# Patient Record
Sex: Male | Born: 1960
Health system: Southern US, Community
[De-identification: ages and names within clinical notes are randomized; demographics above are authoritative.]

## PROBLEM LIST (undated history)

## (undated) DIAGNOSIS — E785 Hyperlipidemia, unspecified: Secondary | ICD-10-CM

## (undated) DIAGNOSIS — I2510 Atherosclerotic heart disease of native coronary artery without angina pectoris: Secondary | ICD-10-CM

## (undated) DIAGNOSIS — K219 Gastro-esophageal reflux disease without esophagitis: Secondary | ICD-10-CM

## (undated) DIAGNOSIS — K760 Fatty (change of) liver, not elsewhere classified: Secondary | ICD-10-CM

## (undated) DIAGNOSIS — K589 Irritable bowel syndrome without diarrhea: Secondary | ICD-10-CM

## (undated) DIAGNOSIS — I10 Essential (primary) hypertension: Secondary | ICD-10-CM

## (undated) DIAGNOSIS — K279 Peptic ulcer, site unspecified, unspecified as acute or chronic, without hemorrhage or perforation: Secondary | ICD-10-CM

## (undated) DIAGNOSIS — N200 Calculus of kidney: Secondary | ICD-10-CM

## (undated) DIAGNOSIS — I970 Postcardiotomy syndrome: Secondary | ICD-10-CM

## (undated) HISTORY — DX: Calculus of kidney: N20.0

## (undated) HISTORY — DX: Peptic ulcer, site unspecified, unspecified as acute or chronic, without hemorrhage or perforation: K27.9

## (undated) HISTORY — DX: Atherosclerotic heart disease of native coronary artery without angina pectoris: I25.10

## (undated) HISTORY — DX: Postcardiotomy syndrome: I97.0

## (undated) HISTORY — DX: Fatty (change of) liver, not elsewhere classified: K76.0

## (undated) HISTORY — DX: Hyperlipidemia, unspecified: E78.5

## (undated) HISTORY — PX: CHOLECYSTECTOMY: SHX55

## (undated) HISTORY — DX: Essential (primary) hypertension: I10

## (undated) HISTORY — DX: Gastro-esophageal reflux disease without esophagitis: K21.9

## (undated) HISTORY — PX: CORONARY ARTERY BYPASS GRAFT: SHX141

## (undated) HISTORY — DX: Irritable bowel syndrome without diarrhea: K58.9

---

## 2005-02-22 ENCOUNTER — Ambulatory Visit: Payer: Self-pay | Admitting: Cardiology

## 2005-05-16 ENCOUNTER — Ambulatory Visit: Payer: Self-pay

## 2005-05-16 ENCOUNTER — Ambulatory Visit: Payer: Self-pay | Admitting: Cardiology

## 2005-06-03 ENCOUNTER — Ambulatory Visit: Payer: Self-pay | Admitting: Cardiology

## 2005-07-06 ENCOUNTER — Ambulatory Visit: Payer: Self-pay | Admitting: Cardiology

## 2005-07-27 ENCOUNTER — Ambulatory Visit: Payer: Self-pay | Admitting: Cardiovascular Disease

## 2005-07-28 ENCOUNTER — Ambulatory Visit: Payer: Self-pay

## 2005-08-26 ENCOUNTER — Ambulatory Visit: Payer: Self-pay | Admitting: Cardiology

## 2006-06-30 ENCOUNTER — Ambulatory Visit: Payer: Self-pay

## 2006-07-04 ENCOUNTER — Ambulatory Visit: Payer: Self-pay | Admitting: Cardiology

## 2006-08-07 ENCOUNTER — Encounter: Admission: RE | Admit: 2006-08-07 | Discharge: 2006-08-07 | Payer: Self-pay | Admitting: Sports Medicine

## 2007-05-28 ENCOUNTER — Ambulatory Visit: Payer: Self-pay

## 2007-06-27 ENCOUNTER — Ambulatory Visit: Payer: Self-pay | Admitting: Cardiology

## 2007-10-08 ENCOUNTER — Ambulatory Visit: Payer: Self-pay | Admitting: Cardiology

## 2008-01-03 ENCOUNTER — Ambulatory Visit: Payer: Self-pay | Admitting: Cardiology

## 2008-01-21 ENCOUNTER — Ambulatory Visit: Payer: Self-pay | Admitting: Cardiology

## 2008-01-21 LAB — CONVERTED CEMR LAB
BUN: 14 mg/dL (ref 6–23)
CO2: 27 meq/L (ref 19–32)
Chloride: 104 meq/L (ref 96–112)
Eosinophils Relative: 4 % (ref 0.0–5.0)
GFR calc Af Amer: 134 mL/min
Glucose, Bld: 120 mg/dL — ABNORMAL HIGH (ref 70–99)
HCT: 47.5 % (ref 39.0–52.0)
INR: 1.1 — ABNORMAL HIGH (ref 0.8–1.0)
Lymphocytes Relative: 28.9 % (ref 12.0–46.0)
Monocytes Absolute: 0.9 10*3/uL (ref 0.1–1.0)
Monocytes Relative: 15 % — ABNORMAL HIGH (ref 3.0–12.0)
Platelets: 207 10*3/uL (ref 150–400)
Potassium: 4.5 meq/L (ref 3.5–5.1)
Prothrombin Time: 11.4 s (ref 10.9–13.3)
RDW: 12.2 % (ref 11.5–14.6)
Sodium: 139 meq/L (ref 135–145)
WBC: 5.7 10*3/uL (ref 4.5–10.5)

## 2008-01-28 ENCOUNTER — Ambulatory Visit: Payer: Self-pay | Admitting: Cardiovascular Disease

## 2008-01-28 ENCOUNTER — Inpatient Hospital Stay (HOSPITAL_BASED_OUTPATIENT_CLINIC_OR_DEPARTMENT_OTHER): Admission: RE | Admit: 2008-01-28 | Discharge: 2008-01-28 | Payer: Self-pay | Admitting: Cardiovascular Disease

## 2008-01-30 ENCOUNTER — Ambulatory Visit: Payer: Self-pay | Admitting: Cardiology

## 2008-01-30 DIAGNOSIS — I25810 Atherosclerosis of coronary artery bypass graft(s) without angina pectoris: Secondary | ICD-10-CM | POA: Insufficient documentation

## 2008-01-30 DIAGNOSIS — E7800 Pure hypercholesterolemia, unspecified: Secondary | ICD-10-CM | POA: Insufficient documentation

## 2008-01-30 DIAGNOSIS — I10 Essential (primary) hypertension: Secondary | ICD-10-CM | POA: Insufficient documentation

## 2009-06-22 ENCOUNTER — Encounter: Payer: Self-pay | Admitting: Cardiology

## 2009-07-14 ENCOUNTER — Encounter: Payer: Self-pay | Admitting: Cardiology

## 2009-09-09 ENCOUNTER — Encounter: Payer: Self-pay | Admitting: Cardiology

## 2009-09-09 ENCOUNTER — Ambulatory Visit: Payer: Self-pay | Admitting: Cardiology

## 2010-03-16 NOTE — Assessment & Plan Note (Signed)
Summary: [facility]  Medications Added ASPIRIN EC 325 MG TBEC (ASPIRIN) Take one tablet by mouth daily ZETIA 10 MG TABS (EZETIMIBE) Take one tablet by mouth daily. FISH OIL 1000 MG CAPS (OMEGA-3 FATTY ACIDS) Take 1 capsule by mouth once a day PRILOSEC OTC 20 MG TBEC (OMEPRAZOLE MAGNESIUM) Take 1 tablet by mouth once a day TOPROL XL 25 MG XR24H-TAB (METOPROLOL SUCCINATE) take 1/2 tablet once daily ALTACE 5 MG CAPS (RAMIPRIL) Take 1 capsule by mouth once a day LIPITOR 40 MG TABS (ATORVASTATIN CALCIUM) take 1 1/2 tablet once daily      Allergies Added: NKDA  Visit Type:  1 year follow up  CC:  No complains.  History of Present Illness: [name] is a  gentleman who has a history of coronary artery disease status post coronary bypass and graft in 2004.  A cardiac catheterization was performed on January 28, 2008, by Dr. Clifton James.  The left main had a 50% lesion.  The LAD had a 90% lesion in the proximal portion followed by a 100% occlusion after a septal perforator.  The circumflex artery had plaque disease in the proximal portion.  The right coronary artery was 100% occluded.  The free LIMA to the LAD was patent.  There was also a free radial graft that had a T-anastomosis into the LIMA.  The T-graft supplied the diagonal as well as the posterior descending artery.  The entire graft was patent.  Also note, it reportedly ties into a marginal. His ejection fraction was 45%-50%.  It was felt that medical therapy was indicated.  If symptoms persisted, then PCI of the left main could be considered.  I last saw him in December of 2009. Since then, he has done well.  There is no dyspnea, chest pain, palpitation, or syncope.  There is no pedal edema.    Current Medications (verified): 1)  Aspirin Ec 325 Mg Tbec (Aspirin) .... Take One Tablet By Mouth Daily 2)  Zetia 10 Mg Tabs (Ezetimibe) .... Take One Tablet By Mouth Daily. 3)  Fish Oil 1000 Mg Caps (Omega-3 Fatty Acids) .... Take 1  Capsule By Mouth Once A Day 4)  Prilosec Otc 20 Mg Tbec (Omeprazole Magnesium) .... Take 1 Tablet By Mouth Once A Day 5)  Toprol Xl 25 Mg Xr24h-Tab (Metoprolol Succinate) .... Take 1/2 Tablet Once Daily 6)  Altace 5 Mg Caps (Ramipril) .... Take 1 Capsule By Mouth Once A Day 7)  Lipitor 40 Mg Tabs (Atorvastatin Calcium) .... Take 1 1/2 Tablet Once Daily  Allergies (verified): No Known Drug Allergies  Past History:  Past Medical History: Reviewed history from 01/30/2008 and no changes required. Hyperlipidemia Hypertension CAD G E R D Peptic ulcer disease IBS Nephrolithiasis Post pericardiotomy syndrome following CABG Fatty liver with mildly elevated LFTs  Past Surgical History: Reviewed history from 01/30/2008 and no changes required. CABG [facility] as free graft to LAD, L radial artery as T graft off LIMA to diagonal, OM1 and PDA) Cholecystectomy  Social History: Reviewed history from 01/30/2008 and no changes required. Married  Tobacco Use - No.  Alcohol Use - yes  Review of Systems       Ankle pain from recent accident but no fevers or chills, productive cough, hemoptysis, dysphasia, odynophagia, melena, hematochezia, dysuria, hematuria, rash, seizure activity, orthopnea, PND, pedal edema, claudication. Remaining systems are negative.   Vital Signs:  Patient profile:   50 year old male Height:      71 inches Weight:  219 pounds BMI:     30.65 Pulse rate:   62 / minute Pulse rhythm:   regular Resp:     18 per minute BP sitting:   178 / 100  (left arm) Cuff size:   large  Vitals Entered By: Vikki Ports (September 09, 2009 4:10 PM)  Physical Exam  General:  Well-developed well-nourished in no acute distress.  Skin is warm and dry.  HEENT is normal.  Neck is supple. No thyromegaly.  Chest is clear to auscultation with normal expansion.  Cardiovascular exam is regular rate and rhythm.  Abdominal exam nontender or distended. No masses palpated. Extremities show  no edema. neuro grossly intact    EKG  Procedure date:  09/09/2009  Findings:      Normal sinus rhythm at a rate of 62. Axis normal. Cannot rule out prior inferior infarct.  Impression & Recommendations:  Problem # 1:  HYPERCHOLESTEROLEMIA, PURE (ICD-272.0)  Continue present medications. Lipids and  liver monitored by his primary care. His updated medication list for this problem includes:    Zetia 10 Mg Tabs (Ezetimibe) .Marland Kitchen... Take one tablet by mouth daily.    Lipitor 40 Mg Tabs (Atorvastatin calcium) .Marland Kitchen... Take 1 1/2 tablet once daily  His updated medication list for this problem includes:    Zetia 10 Mg Tabs (Ezetimibe) .Marland Kitchen... Take one tablet by mouth daily.    Lipitor 40 Mg Tabs (Atorvastatin calcium) .Marland Kitchen... Take 1 1/2 tablet once daily  Problem # 2:  HYPERTENSION, BENIGN (ICD-401.1) Patient's blood pressure is elevated. However he states it is typically in the normal range. I've asked him to bring his home cuff to correlate with ours. We will increase his medications as needed. His updated medication list for this problem includes:    Aspirin Ec 325 Mg Tbec (Aspirin) .Marland Kitchen... Take one tablet by mouth daily    Toprol Xl 25 Mg Xr24h-tab (Metoprolol succinate) .Marland Kitchen... Take 1/2 tablet once daily    Altace 5 Mg Caps (Ramipril) .Marland Kitchen... Take 1 capsule by mouth once a day  Problem # 3:  CAD, ARTERY BYPASS GRAFT (ICD-414.04) continue aspirin, ACE inhibitor, beta blocker and statin. Continue risk factor modification. His updated medication list for this problem includes:    Aspirin Ec 325 Mg Tbec (Aspirin) .Marland Kitchen... Take one tablet by mouth daily    Toprol Xl 25 Mg Xr24h-tab (Metoprolol succinate) .Marland Kitchen... Take 1/2 tablet once daily    Altace 5 Mg Caps (Ramipril) .Marland Kitchen... Take 1 capsule by mouth once a day  Orders: EKG w/ Interpretation (93000)  Patient Instructions: 1)  Your physician recommends that you schedule a follow-up appointment in: 1 YEAR WITH DR Jens Som IN [place] OFFICE 2)  Your  physician recommends that you continue on your current medications as directed. Please refer to the Current Medication list given to you today.

## 2010-03-16 NOTE — Letter (Signed)
Summary: [facility]  [facility]   Imported By: Marylou Mccoy 09/10/2009 16:57:21  _____________________________________________________________________  External Attachment:    Type:   Image     Comment:   External Document

## 2010-06-29 NOTE — Cardiovascular Report (Signed)
NAMETYRECE, VANTERPOOL                ACCOUNT NO.:  [id number]   MEDICAL RECORD NO.:  [id number]          PATIENT TYPE:  OIB   LOCATION:  1961                         FACILITY:  [facility]   PHYSICIAN:  Verne Carrow, MDDATE OF BIRTH:  16-Sep-1960   DATE OF PROCEDURE:  01/28/2008  DATE OF DISCHARGE:  01/28/2008                            CARDIAC CATHETERIZATION   PRIMARY CARDIOLOGIST:  Madolyn Frieze. Jens Som, MD, [facility]   PROCEDURE PERFORMED:  1. Left heart catheterization.  2. Selective coronary angiography.  3. Left ventricular angiogram.  4. Free left internal mammary artery graft angiography with T-      anastomosis of the free radial artery graft.   OPERATOR:  Verne Carrow, MD   INDICATIONS:  Chest pain in a patient with known coronary artery disease  status post CABG in 2004.   PROCEDURE IN DETAIL:  The patient was brought to the Cardiac  Catheterization Laboratory after signing informed consent for the  procedure.  The right groin was prepped and draped in the sterile  fashion.  A 4-French sheath was inserted into the right femoral artery  without difficulty.  Lidocaine 1% was used for local anesthesia.  A 4-  [place] JL-4 diagnostic catheter was used to selectively engage and  inject the native left coronary system.  A 3-DRC catheter was used to  selectively engage and inject the free left internal mammary artery  graft as well as the native right coronary artery.  A left ventricular  angiogram was performed in the RAO projection with a 4-French pigtail  catheter.  The pigtail catheter was pulled back across the aortic valve  with no significant pressure gradient measured.  The patient tolerated  the procedure well and was taken to the holding area in stable  condition.   ANGIOGRAPHIC FINDINGS:  1. The left main coronary artery has a long diffuse segment of 50%      narrowing extending from the ostium to the distal portion of distal      segment.  2. The left anterior  descending has severe 90% lesions in the proximal      portion followed by 100% occlusion after a septal perforator in the      midportion.  The mid and distal LAD fills from the bypass graft.      There is also a large diagonal branch that fills from the bypass      graft.  3. The circumflex artery has plaque disease noted in the proximal      portion and gives off several large marginal branches that have      plaque disease noted.  The AV groove circumflex vessel is small to      moderate sized vessel that has plaque disease.  4. The right coronary artery has a 100% proximal occlusion just past      the ostium.  The distal right coronary artery as well as the      posterior descending artery and a posterolateral segment fill from      the bypass graft.  5. There is a free left internal mammary artery graft that  ties into      the mid LAD.  There is also a free radial artery graft that has a T-      anastomosis into the left internal mammary artery graft.  His T-      graft supplies the diagonal vessel as well as the posterior      descending artery.  This entire graft is patent.  This is      reportedly also tied into a marginal branch which is not seen on      angiography.  6. The left ventricular angiogram demonstrates inferoapical and      anteroapical hypokineses with an overall ejection fraction of 45-      50%.   HEMODYNAMIC DATA:  Left ventricular pressure 119/12, end-diastolic  pressure 15, and central aortic pressure 117/67.   IMPRESSION:  1. Severe double-vessel coronary artery disease with moderate left      main stenosis, status post three-vessel coronary artery bypass      grafting with 3/3 patent graft.  2. Mild segmental left ventricular dysfunction.   RECOMMENDATIONS:  I would recommend continued medical management.  The  patient continues to have chest pain despite aggressive medical therapy  and we could consider percutaneous coronary intervention of the left   main coronary artery that supplies an unprotected circumflex system.  The patient will follow up with his primary cardiologist, Dr. Jens Som  next week.      Verne Carrow, MD  Electronically Signed     CM/MEDQ  D:  01/28/2008  T:  01/28/2008  Job:  [id number]   cc:   Madolyn Frieze. Jens Som, MD, [facility]

## 2010-06-29 NOTE — Assessment & Plan Note (Signed)
[facility]                            CARDIOLOGY OFFICE NOTE   SAHAS, SLUKA                       MRN:          [id number]  DATE:07/04/2006                            DOB:          [birth year]    [name] is a pleasant, 50 year old gentleman, who has a history of  coronary disease, status post coronary bypass and grafting in 2004 in  [place].  He was last seen by Tereso Newcomer in the clinic on July 27, 2005.  At that time, he had multiple complaints.  He did have urine for  metanephrons due to elevated blood pressure.  This was negative.  He has  also had renal Doppler's performed in June 2007, and this showed no  significant renal artery stenosis.  His most recent Myoview was  performed on Jun 30, 2006.  His ejection fraction was 52%.  There was a  prior anterior and inferior septal infarct, but no ischemia.  Since I  last saw him, he denies any chest pain, dyspnea, palpitations, or  syncope.  He is exercising routinely and following a diet.  He does not  smoke.   MEDICATIONS:  1. Lipitor 40 mg p.o. daily.  2. Zetia 10 mg p.o. daily.  3. Folate.  4. Aspirin 325 mg p.o. daily.  5. Fish oil.  6. Prilosec.  7. Toprol 12.5 mg p.o. daily.  8. Altace 5 mg p.o. daily.   PHYSICAL EXAMINATION:  VITAL SIGNS:  Blood pressure is 136/95, and his  pulse is 65.  He weighs 221 pounds.  HEENT:  Normal.  NECK:  Supple with no bruits.  CHEST:  Clear.  CARDIOVASCULAR:  Regular rhythm.  ABDOMEN:  No pulsatile masses and no bruits.  EXTREMITIES:  No edema.   DIAGNOSES:  1. Coronary artery disease, status post coronary bypass and graft -      his most recent Myoview showed infarct, but no ischemia and      preserved left ventricular function.  He is not having chest pain.      We will continue with medical therapy, including his aspirin, beta      blocker, Altace, and a Statin.  2. Hyperlipidemia - we will have his most recent lipid panel  forwarded      to [place] from Dr. Fidela Salisbury office, and we will adjust with a      goal LDL of less than 70.  3. History of mildly elevated liver functions - we will have his most      recent liver functions forwarded to [place] for our records.  4. History of fatty liver.  5. Hypertension - his blood pressure is elevated today, but he states      it is running in the 120/70 range.  We will track this and increase      his medications as indicated.  6. He will continue his risk factor modification, and we will see him      back in approximately six months in [place].     Madolyn Frieze Jens Som, MD, [facility]  Electronically Signed  BSC/MedQ  DD: 07/04/2006  DT: 07/05/2006  Job #: [id number]   cc:   Dalbert Mayotte, M.D.

## 2010-06-29 NOTE — Assessment & Plan Note (Signed)
[facility]                            CARDIOLOGY OFFICE NOTE   [name], [name]                       MRN:          [id number]  DATE:10/08/2007                            DOB:          07-Dec-1960    [name] is a 50 year old gentleman who has a history of coronary  artery disease status post coronary bypass and graft in 2004 in  [place].  We did perform a Myoview on him on 05/28/2007.  At that  time, he was felt to have normal LV function with an ejection fraction  of 53% and no significant ischemia.  I last saw him on 06/27/2007.  The  patient states that approximately 2 weeks ago, he was developing some  chest tightness.  It was diffuse in nature.  It did not radiate.  It was  not pleuritic or positional or related to food.  It reoccur with  exertion and after he stopped his activities, he will gradually resolve  after approximately an hour and a half.  He also has had some problems  with soreness in his sternum after skiing and vigorous physical  activity, but he states he had that previously.  He also has had some  back issues and wonder whether there is some referred pain from his  back.  Now for the past week he has exercised vigorously and has had no  chest pain.  This includes spin classes, weight lifting, and vigorous  skiing.  Note, there is no associated nausea, vomiting, shortness of  breath, or increased diaphoresis.  He denies any dyspnea, palpitations,  or syncope.  There is no pedal edema.   His medications include Zetia 10 mg p.o. daily, aspirin 325 mg p.o.  daily, fish oil, Prilosec, Toprol 12.5 mg p.o. daily, Altace 5 mg p.o.  daily, and Lipitor 60 mg p.o. daily.   His physical exam today shows a blood pressure of 152/92 and his pulse  is 61.  His HEENT is normal.  His neck is supple with no bruits.  His  chest is clear.  His cardiovascular exam reveals a bradycardic rate with  a regular rhythm.  His abdominal exam shows  no tenderness.  Extremities  show no edema.   DIAGNOSES:  1. Chest pain - somewhat [name] [name] regional description of his pain      was concerning and that it occurred with exertion.  However, it      would last for an hour and a half after his activities were ceased.      Also note that the past week he has exercised vigorously and has      had no symptoms.  His electrocardiogram does not appear to be      significantly changed from previous.  His Myoview from April was      low risk.  I have discussed the options including close observation      versus cardiac catheterization.  For now we will plan to allow him      to continue with his present medications.  I will see him back in 6  weeks.  If he has recurrent chest pain then we will need to proceed      with catheterization at that point.  I do not think it would be      worthwhile to repeat his stress test as he had a recent one in      April.  2. Coronary artery disease status post coronary bypass and graft - he      will continue on his aspirin, statin, beta-blocker, and ACE      inhibitor.  Note, he does not smoke.  3. Hyperlipidemia - he will continue on his Lipitor, Zetia, and fish      oil and Dr. Drue Second is following his lipids and liver.  4. History of fatty liver - per Dr. Drue Second.  5. Hypertension - his blood pressure is elevated today.  We again      discussed the potential of increasing or adding additional      medications.  However, he states this made him feel poorly in the      past.  He also states that his blood pressure typically runs      120/80.  We will ask him      to continue to track this and I will adjust his regimen based on      those results.  I will see him back in [place] in 6 weeks.     Madolyn Frieze Jens Som, MD, [facility]  Electronically Signed    BSC/MedQ  DD: 10/08/2007  DT: 10/09/2007  Job #: [id number]

## 2010-06-29 NOTE — Assessment & Plan Note (Signed)
[facility]                            CARDIOLOGY OFFICE NOTE   KERRION, KEMPPAINEN                       MRN:          [id number]  DATE:06/27/2007                            DOB:          29-Dec-1960    [name] is a 50 year old gentleman who has a history of coronary  artery disease, status post coronary artery bypass and graft in 2004 in  [place].  His most recent Myoview was performed on May 28, 2007.  At that time he exercised for 13 minutes and 30 seconds.  There were no  ST changes.  There was no ischemia or infarction per Dr. Gala Romney, and  his ejection fraction was 53%.  Since I last saw him, he is doing well  from a symptomatic standpoint.  There is no chest pain, dyspnea,  palpitations, or syncope.  There is no pedal edema.  He is exercising  routinely.   His medications include:  1. Zetia 10 mg p.o. daily.  2. Aspirin 325 mg p.o. daily.  3. Fish oil 1 g p.o. daily.  4. Prilosec.  5. Toprol 12.5 mg p.o. daily.  6. Altace 5 mg p.o. daily.  7. Lipitor 60 mg p.o. daily.   PHYSICAL EXAMINATION:  Physical exam today shows a blood pressure of  139/96, and his pulse is 71.  His HEENT is normal.  His neck is supple.  There are no bruits noted.  His chest is clear.  His cardiovascular exam reveals a regular rate and rhythm.  Abdominal exam shows no tenderness.  Extremities show no edema.   DIAGNOSES:  1. Coronary artery disease, status post coronary artery bypass and      graft:  His Myoview shows no ischemia or infarction and preserved      left ventricular function.  We will continue medical therapy      including his aspirin, beta blocker, statin and angiotensin-      converting enzyme inhibitor.  2. Hyperlipidemia:  He will continue on his Lipitor, Zetia and fish      oil.  Dr. Drue Second is following his lipids and liver.  3. History of fatty liver:  His liver functions are being monitored by      Dr. Drue Second, and he also sees a  gastroenterologist.  4. Hypertension:  His blood pressure is elevated today, but he states      it is typically in the 120/80 range.  He will continue with his      present medications and we can adjust as indicated.   He will see [place] back in 1 year.     Madolyn Frieze Jens Som, MD, [facility]  Electronically Signed    BSC/MedQ  DD: 06/27/2007  DT: 06/27/2007  Job #: [id number]   cc:   Dalbert Mayotte, M.D.

## 2010-06-29 NOTE — Assessment & Plan Note (Signed)
[facility]                            CARDIOLOGY OFFICE NOTE   ZIAIRE, HAGOS                       MRN:          [id number]  DATE:01/30/2008                            DOB:          January 07, 1961    [name] [name] is a 50 year old gentleman who has a history of coronary  artery disease status post coronary bypass and graft in 2004.  When I  last saw him on January 03, 2008, he was continuing to have atypical  chest pain.  We scheduled him to have a cardiac catheterization, which  was performed on January 28, 2008, by Dr. Clifton James.  The left main had  a 50% lesion.  The LAD had a 90% lesion in the proximal portion followed  by a 100% occlusion after a septal perforator.  The circumflex artery  had plaque disease in the proximal portion.  The right coronary artery  was 100% occluded.  The free LIMA to the LAD was patent.  There was also  a free radial graft that had a T-anastomosis into the LIMA.  The T-graft  supplied the diagonal as well as the posterior descending artery.  The  entire graft was patent.  Also note, it reportedly ties into a marginal.  His ejection fraction was 45%-50%.  It was felt that medical therapy was  recommended.  If symptoms persisted, then PCI of the left main could be  considered.  Since then, he has done well.  There is no dyspnea, chest  pain, palpitation, or syncope.  There is no pedal edema.   MEDICATIONS:  1. Zetia 10 mg p.o. daily.  2. Aspirin 325 mg p.o. daily.  3. Fish oil.  4. Prilosec 20 mg p.o. daily.  5. Toprol 12.5 mg p.o. daily.  6. Altace 5 mg p.o. daily.  7. Lipitor 60 mg p.o. daily.   PHYSICAL EXAMINATION:  VITAL SIGNS:  Blood pressure of 159/97.  His  pulse is 65.  HEENT:  Normal.  NECK:  Supple.  CHEST:  Clear.  CARDIOVASCULAR:  Regular rate and rhythm.  ABDOMEN:  Shows no tenderness.  His right groin shows no hematoma.  No  bruit.  EXTREMITIES:  No edema.   DIAGNOSES:  1. Coronary artery  disease - I have reviewed Mr. Jares      catheterization with him.  He is well revascularized.  He does have      a 50% left main, which potentially could jeopardize circumflex in      the future, but he does not have any symptoms from this at present      and I think medical therapy is appropriate.  We will continue with      his aspirin, beta-blocker, statin, and ACE inhibitor.  I will      increase his Altace to 10 mg p.o. daily.  We will check a BMET in 1      week and follow his potassium and renal function.  He will continue      with diet and exercise.  He does not smoke.  2. Hyperlipidemia - He will continue  on his Lipitor, Zetia, and fish      oil.  We will check lipids and liver and adjust as indicated.  3. History of fatty liver.  4. Hypertension - His blood pressure is elevated today.  We are      increasing his Altace and we will track this and adjust his regimen      as indicated.   He will see me back in 3 months.     Madolyn Frieze Jens Som, MD, [facility]  Electronically Signed    BSC/MedQ  DD: 01/30/2008  DT: 01/31/2008  Job #: [id number]

## 2010-06-29 NOTE — Assessment & Plan Note (Signed)
[facility]                            CARDIOLOGY OFFICE NOTE   MARQUICE, UDDIN                       MRN:          [id number]  DATE:01/03/2008                            DOB:          November 09, 1960    [name] [name] is a 50 year old gentleman who has a history of coronary  artery disease status post coronary artery bypass graft in 2004 in  [place].  He subsequently had a LIMA as a free graft off the  ascending aorta to the LAD, left radial artery as a T-graft off of the  LIMA with a sequential anastomosis to the first diagonal as well as the  first marginal and PDA.  Since I last saw him, he continues to have  occasional chest pain.  The pain is substernal in location.  It is  described as a pressure.  It does not radiate.  It is not pleuritic.  It can occur with exertion, but it can also occur at rest.  There is no  associated nausea, vomiting, shortness of breath, or diaphoresis.  He is  unclear whether this is similar to his symptoms previously or not.  Note, his most recent Myoview was performed on May 28, 2007.  It was  normal.  He denies any dyspnea on exertion, orthopnea, PND, pedal edema,  palpitations, presyncope, or syncope.  His medications at present  include Zetia 10 mg p.o. daily, aspirin 325 mg p.o. daily, fish oil,  Prilosec, Toprol 12.5 mg p.o. daily, Altace 5 mg p.o. daily, and Lipitor  60 mg p.o. daily.   PHYSICAL EXAMINATION:  VITAL SIGNS:  Today shows a blood pressure of  139/84, and his pulse is 57.  He weighs 231 pounds.  HEENT:  Normal.  NECK:  Supple.  CHEST:  Clear.  CARDIOVASCULAR:  Regular rate and rhythm.  ABDOMEN:  No tenderness.  EXTREMITIES:  No edema.   His electrocardiogram shows a sinus rhythm at a rate of 60.  The axis is  normal.  There are no ST changes noted.  There is a prior inferior  infarct.   DIAGNOSES:  1. Continuing chest pain - Mr. Mincey symptoms have both typical and      atypical  features.  Given that they are persistent, I think we      should proceed with cardiac catheterization.  This is particularly      in light of his most recent Myoview in April being normal.  The      risks and benefits of cardiac catheterization had been discussed      with Mr. Radu, and he agrees to proceed.  We will arrange this as      an outpatient.  We will make further recommendations once we know      his anatomy.  2. Coronary artery disease, status post coronary artery bypass graft -      continue his aspirin, statin, beta-blocker, and ACE inhibitor.  He      does not smoke, and he does exercise routinely.  3. Hyperlipidemia - continue on his Lipitor, Zetia, and fish oil, and  Dr. Marcha Dutton is following this.  4. History of fatty liver.  5. Hypertension - the patient's blood pressure is reasonably well      controlled on his present medications.   We will see him back in 6 weeks.     Madolyn Frieze Jens Som, MD, [facility]  Electronically Signed    BSC/MedQ  DD: 01/03/2008  DT: 01/04/2008  Job #: [id number]

## 2010-09-28 ENCOUNTER — Encounter: Payer: Self-pay | Admitting: Cardiology

## 2011-02-22 ENCOUNTER — Encounter: Payer: Self-pay | Admitting: Cardiology

## 2011-02-22 ENCOUNTER — Ambulatory Visit (INDEPENDENT_AMBULATORY_CARE_PROVIDER_SITE_OTHER): Payer: 59 | Admitting: Cardiology

## 2011-02-22 VITALS — BP 160/95 | HR 63 | Ht 71.0 in | Wt 226.0 lb

## 2011-02-22 DIAGNOSIS — I25810 Atherosclerosis of coronary artery bypass graft(s) without angina pectoris: Secondary | ICD-10-CM

## 2011-02-22 DIAGNOSIS — E7800 Pure hypercholesterolemia, unspecified: Secondary | ICD-10-CM

## 2011-02-22 DIAGNOSIS — I2510 Atherosclerotic heart disease of native coronary artery without angina pectoris: Secondary | ICD-10-CM

## 2011-02-22 DIAGNOSIS — I10 Essential (primary) hypertension: Secondary | ICD-10-CM

## 2011-02-22 NOTE — Patient Instructions (Signed)
Your physician wants you to follow-up in: one year  You will receive a reminder letter in the mail two months in advance. If you don't receive a letter, please call our office to schedule the follow-up appointment.  Your physician has requested that you have en exercise stress myoview. For further information please visit [email or web address]. Please follow instruction sheet, as given.    

## 2011-02-22 NOTE — Assessment & Plan Note (Signed)
Blood pressure elevated but he states typically normal. Continue present medications. He does not want me to advance his medications at this point. Potassium and renal function monitored by primary care.

## 2011-02-22 NOTE — Progress Notes (Signed)
HPI:[name] [name] is a  gentleman who has a history of coronary artery disease status post coronary bypass and graft in 2004.  A cardiac catheterization was performed on January 28, 2008, by Dr. Clifton James.  The left main had a 50% lesion.  The LAD had a 90% lesion in the proximal portion followed by a 100% occlusion after a septal perforator.  The circumflex artery had plaque disease in the proximal portion.  The right coronary artery was 100% occluded.  The free LIMA to the LAD was patent.  There was also a free radial graft that had a T-anastomosis into the LIMA.  The T-graft supplied the diagonal as well as the posterior descending artery.  The entire graft was patent.  Also note, it reportedly ties into a marginal. His ejection fraction was 45%-50%.  It was felt that medical therapy was indicated.  If symptoms persisted, then PCI of the left main could be considered.  I last saw him in July of 2011. Since then, he denies dyspnea, chest pain, palpitations or syncope. Approximately one month ago he did notice that his heart rate was elevated most of the day. It was in the 120 range. No associated symptoms.  Current Outpatient Prescriptions  Medication Sig Dispense Refill  . aspirin EC 325 MG tablet Take 325 mg by mouth daily.        Marland Kitchen atorvastatin (LIPITOR) 40 MG tablet Take 60 mg by mouth daily.        Marland Kitchen DEXILANT 60 MG capsule 1 tab daily      . ezetimibe (ZETIA) 10 MG tablet Take 10 mg by mouth daily.        Marland Kitchen HYDROcodone-acetaminophen (VICODIN) 5-500 MG per tablet As needed      . metoprolol succinate (TOPROL XL) 25 MG 24 hr tablet Take 1/2 tab daily      . Omega-3 Fatty Acids (FISH OIL) 1000 MG CAPS Take by mouth daily.        . ramipril (ALTACE) 5 MG capsule Take 5 mg by mouth daily.           Past Medical History  Diagnosis Date  . HLD (hyperlipidemia)   . HTN (hypertension)   . CAD (coronary artery disease)   . GERD (gastroesophageal reflux disease)   . PUD (peptic ulcer disease)   .  IBS (irritable bowel syndrome)   . Nephrolithiasis   . Post pericardiotomy syndrome   . Fatty liver     with mildly elevated LFTs    Past Surgical History  Procedure Date  . Coronary artery bypass graft     lima as free graft to LAD, L radial artery as T graft off Lim to diagonal, OM1 and PDA  . Cholecystectomy     History   Social History  . Marital Status: Married    Spouse Name: N/A    Number of Children: N/A  . Years of Education: N/A   Occupational History  . Not on file.   Social History Main Topics  . Smoking status: Former Smoker    Types: Cigarettes  . Smokeless tobacco: Not on file   Comment: quit 12 years ago  . Alcohol Use: Yes  . Drug Use: Not on file  . Sexually Active: Not on file   Other Topics Concern  . Not on file   Social History Narrative  . No narrative on file    ROS: no fevers or chills, productive cough, hemoptysis, dysphasia, odynophagia, melena, hematochezia, dysuria, hematuria,  rash, seizure activity, orthopnea, PND, pedal edema, claudication. Remaining systems are negative.  Physical Exam: Well-developed well-nourished in no acute distress.  Skin is warm and dry.  HEENT is normal.  Neck is supple. No thyromegaly.  Chest is clear to auscultation with normal expansion. Previous sternotomy Cardiovascular exam is regular rate and rhythm.  Abdominal exam nontender or distended. No masses palpated. Extremities show no edema. neuro grossly intact  ECG sinus rhythm, cannot rule out prior inferior infarct.

## 2011-02-22 NOTE — Assessment & Plan Note (Signed)
Plan continue aspirin and statin. Schedule Myoview for risk stratification. 

## 2011-02-22 NOTE — Assessment & Plan Note (Signed)
Continue statin. Lipids and liver monitored by primary care. 

## 2011-02-23 ENCOUNTER — Ambulatory Visit: Payer: Self-pay | Admitting: Cardiology

## 2011-02-28 ENCOUNTER — Ambulatory Visit (HOSPITAL_COMMUNITY): Payer: 59 | Attending: Internal Medicine | Admitting: Radiology

## 2011-02-28 VITALS — Ht 71.0 in | Wt 219.0 lb

## 2011-02-28 DIAGNOSIS — R0609 Other forms of dyspnea: Secondary | ICD-10-CM | POA: Insufficient documentation

## 2011-02-28 DIAGNOSIS — I10 Essential (primary) hypertension: Secondary | ICD-10-CM | POA: Insufficient documentation

## 2011-02-28 DIAGNOSIS — Z87891 Personal history of nicotine dependence: Secondary | ICD-10-CM | POA: Insufficient documentation

## 2011-02-28 DIAGNOSIS — R000 Tachycardia, unspecified: Secondary | ICD-10-CM | POA: Insufficient documentation

## 2011-02-28 DIAGNOSIS — Z951 Presence of aortocoronary bypass graft: Secondary | ICD-10-CM | POA: Insufficient documentation

## 2011-02-28 DIAGNOSIS — R0989 Other specified symptoms and signs involving the circulatory and respiratory systems: Secondary | ICD-10-CM | POA: Insufficient documentation

## 2011-02-28 DIAGNOSIS — R0602 Shortness of breath: Secondary | ICD-10-CM

## 2011-02-28 DIAGNOSIS — E785 Hyperlipidemia, unspecified: Secondary | ICD-10-CM | POA: Insufficient documentation

## 2011-02-28 DIAGNOSIS — I2510 Atherosclerotic heart disease of native coronary artery without angina pectoris: Secondary | ICD-10-CM | POA: Insufficient documentation

## 2011-02-28 MED ORDER — TECHNETIUM TC 99M TETROFOSMIN IV KIT
30.0000 | PACK | Freq: Once | INTRAVENOUS | Status: AC | PRN
  Administered 2011-02-28: 30 via INTRAVENOUS

## 2011-02-28 MED ORDER — TECHNETIUM TC 99M TETROFOSMIN IV KIT
10.0000 | PACK | Freq: Once | INTRAVENOUS | Status: AC | PRN
  Administered 2011-02-28: 10 via INTRAVENOUS

## 2011-02-28 NOTE — Progress Notes (Signed)
[facility] SITE 3 NUCLEAR MED [address] [place] [address] [phone]  Cardiology Nuclear Med Study  [name] [name] is a 51 y.o. male [id number] [birth year]   Nuclear Med Background Indication for Stress Test:  Evaluation for Ischemia and Graft Patency History:  '04 CABG,4/09 Myocardial Perfusion Study-Normal, EF=53%,12/09 Heart Catheterization-Patent grafts, N/O CAD, EF=45-50% Cardiac Risk Factors: History of Smoking, Hypertension and Lipids  Symptoms:  DOE and Rapid HR   Nuclear Pre-Procedure Caffeine/Decaff Intake:  7:00pm NPO After: 9:00pm   Lungs:  Clear IV 0.9% NS with Angio Cath:  20g  IV Site: R Hand  IV Started by:  Cathlyn Parsons, RN  Chest Size (in):  46 Cup Size: n/a  Height: 5\' 11"  (1.803 m)  Weight:  219 lb (99.338 kg)  BMI:  Body mass index is 30.54 kg/(m^2). Tech Comments:  Toprol held x 24 hrs    Nuclear Med Study 1 or 2 day study: 1 day  Stress Test Type:  Stress  Reading MD: Dietrich Pates, MD  Order Authorizing Provider:  Ripley Fraise  Resting Radionuclide: Technetium 84m Tetrofosmin  Resting Radionuclide Dose: 10.7 mCi   Stress Radionuclide:  Technetium 52m Tetrofosmin  Stress Radionuclide Dose: 33.0 mCi           Stress Protocol Rest HR: 54 Stress HR: 162  Rest BP: 147/90 Stress BP: 215/93  Exercise Time (min): 14:00 METS: 17.2   Predicted Max HR: 172 bpm % Max HR: 94.19 bpm Rate Pressure Product: 21308   Dose of Adenosine (mg):  n/a Dose of Lexiscan: n/a mg  Dose of Atropine (mg): n/a Dose of Dobutamine: n/a mcg/kg/min (at max HR)  Stress Test Technologist: Irean Hong, RN  Nuclear Technologist:  Domenic Polite, CNMT     Rest Procedure: Myocardial Perfusion Imaging was performed at rest 45 minutes following the intravenous administration of Technetium 41m Tetrofosmin.  Rest ECG:  SB-NSR with nonspecific T wave changes  Stress Procedure:  The patient exercised for 14 minutes, RPE=17.  The patient stopped  due to DOE, (L) ankle pain and denied any chest pain.  There were no significant ST-T wave changes. There were frequent PVC's, Bigeminy and trigeminy. There was a hypertensive response to exercise. Technetium 30m Tetrofosmin was injected at peak exercise and myocardial perfusion imaging was performed after a brief delay. Stress ECG: No significant change from baseline ECG  QPS Raw Data Images:  Images were motion corrected.  Soft tissue (diaphragm) underlie heart. Stress Images:  Defect in the inferior wall (base, mid and minimally distal) as well as apex.   Rest Images:   Subtraction (SDS):  Minimal change from the stress images, only at apex. Transient Ischemic Dilatation (Normal <1.22):  0.99 Lung/Heart Ratio (Normal <0.45):  0.31  Quantitative Gated Spect Images QGS EDV:  170 ml QGS ESV:  76 ml QGS cine images:  Normal wall motion inferiorly. QGS EF: 55%  Impression Exercise Capacity:  Excellent exercise capacity. BP Response:  Normal blood pressure response. Clinical Symptoms:  No chest pain. ECG Impression:  No significant ST segment change suggestive of ischemia. Comparison with Prior Nuclear Scan:  Previous inferior defect not described.  No pictures to compare  Overall Impression:  Clinically and electrically negative for ischemia.  Excellent exercise capacity.  INferior defect consistent with subendocardial scar and/or soft tissue attenuation  No evidence of ischemia.

## 2012-03-06 ENCOUNTER — Ambulatory Visit: Payer: 59 | Admitting: Cardiology

## 2012-03-21 ENCOUNTER — Ambulatory Visit: Payer: 59 | Admitting: Cardiology

## 2012-04-12 ENCOUNTER — Encounter: Payer: Self-pay | Admitting: Cardiology

## 2012-04-12 ENCOUNTER — Ambulatory Visit (INDEPENDENT_AMBULATORY_CARE_PROVIDER_SITE_OTHER): Payer: 59 | Admitting: Cardiology

## 2012-04-12 VITALS — BP 130/84 | HR 58 | Ht 72.0 in | Wt 226.0 lb

## 2012-04-12 DIAGNOSIS — E7800 Pure hypercholesterolemia, unspecified: Secondary | ICD-10-CM

## 2012-04-12 DIAGNOSIS — I10 Essential (primary) hypertension: Secondary | ICD-10-CM

## 2012-04-12 DIAGNOSIS — I2510 Atherosclerotic heart disease of native coronary artery without angina pectoris: Secondary | ICD-10-CM

## 2012-04-12 DIAGNOSIS — I25810 Atherosclerosis of coronary artery bypass graft(s) without angina pectoris: Secondary | ICD-10-CM

## 2012-04-12 NOTE — Assessment & Plan Note (Signed)
Continue aspirin and statin. Plan Myoview when he returns in one year. 

## 2012-04-12 NOTE — Assessment & Plan Note (Signed)
Blood pressure controlled. Continue present medications. Potassium and renal function monitored by primary care. 

## 2012-04-12 NOTE — Patient Instructions (Addendum)
Your physician wants you to follow-up in: ONE YEAR WITH DR CRENSHAW You will receive a reminder letter in the mail two months in advance. If you don't receive a letter, please call our office to schedule the follow-up appointment.  

## 2012-04-12 NOTE — Assessment & Plan Note (Signed)
Continue statin.lipids and liver monitored by primary care. 

## 2012-04-12 NOTE — Progress Notes (Signed)
HPI: [name] is a gentleman who has a history of coronary artery disease status post coronary bypass and graft in 2004. A cardiac catheterization was performed on January 28, 2008, by Dr. Clifton James. The left main had a 50% lesion. The LAD had a 90% lesion in the proximal portion followed by a 100% occlusion after a septal perforator. The circumflex artery had plaque disease in the proximal portion. The right coronary artery was 100% occluded. The free LIMA to the LAD was patent. There was also a free radial graft that had a T-anastomosis into the LIMA. The T-graft supplied the diagonal as well as the posterior descending artery. The entire graft was patent. Also note, it reportedly ties into a marginal. His ejection fraction was 45%-50%. It was felt that medical therapy was indicated. If symptoms persisted, then PCI of the left main could be considered. Myoview in Jan 2013 showed EF 55, inferior scar, no ischemia. I last saw him in Jan 2013. Since then, the patient denies any dyspnea on exertion, orthopnea, PND, pedal edema, palpitations, syncope or chest pain.    Current Outpatient Prescriptions  Medication Sig Dispense Refill  . aspirin EC 325 MG tablet Take 325 mg by mouth daily.        Marland Kitchen atorvastatin (LIPITOR) 40 MG tablet Take 60 mg by mouth daily.        Marland Kitchen DEXILANT 60 MG capsule 1 tab daily      . ezetimibe (ZETIA) 10 MG tablet Take 10 mg by mouth daily.        Marland Kitchen HYDROcodone-acetaminophen (VICODIN) 5-500 MG per tablet As needed      . metoprolol succinate (TOPROL XL) 25 MG 24 hr tablet Take 1/2 tab daily      . Omega-3 Fatty Acids (FISH OIL) 1000 MG CAPS Take by mouth daily.        . ramipril (ALTACE) 5 MG capsule Take 5 mg by mouth daily.         No current facility-administered medications for this visit.     Past Medical History  Diagnosis Date  . HLD (hyperlipidemia)   . HTN (hypertension)   . CAD (coronary artery disease)   . GERD (gastroesophageal reflux disease)   . PUD  (peptic ulcer disease)   . IBS (irritable bowel syndrome)   . Nephrolithiasis   . Post pericardiotomy syndrome   . Fatty liver     with mildly elevated LFTs    Past Surgical History  Procedure Laterality Date  . Coronary artery bypass graft      lima as free graft to LAD, L radial artery as T graft off Lim to diagonal, OM1 and PDA  . Cholecystectomy      History   Social History  . Marital Status: Married    Spouse Name: N/A    Number of Children: N/A  . Years of Education: N/A   Occupational History  . Not on file.   Social History Main Topics  . Smoking status: Former Smoker    Types: Cigarettes  . Smokeless tobacco: Not on file     Comment: quit 12 years ago  . Alcohol Use: Yes  . Drug Use: Not on file  . Sexually Active: Not on file   Other Topics Concern  . Not on file   Social History Narrative  . No narrative on file    ROS: no fevers or chills, productive cough, hemoptysis, dysphasia, odynophagia, melena, hematochezia, dysuria, hematuria, rash, seizure activity, orthopnea, PND,  pedal edema, claudication. Remaining systems are negative.  Physical Exam: Well-developed well-nourished in no acute distress.  Skin is warm and dry.  HEENT is normal.  Neck is supple.  Chest is clear to auscultation with normal expansion.  Cardiovascular exam is regular rate and rhythm.  Abdominal exam nontender or distended. No masses palpated. Extremities show no edema. neuro grossly intact  ECG sinus rhythm at a rate of 57. RV conduction delay. Prior inferior infarct.

## 2013-10-30 ENCOUNTER — Telehealth: Payer: Self-pay | Admitting: *Deleted

## 2013-10-30 NOTE — Telephone Encounter (Signed)
EKG reviewed and compared to previous by dr Jens Som Office aware no change.

## 2013-11-01 ENCOUNTER — Encounter: Payer: Self-pay | Admitting: Cardiology

## 2014-12-10 ENCOUNTER — Telehealth: Payer: Self-pay | Admitting: Cardiology

## 2014-12-10 NOTE — Telephone Encounter (Signed)
[name] returned your call . Please call   Thanks

## 2014-12-10 NOTE — Telephone Encounter (Signed)
Unable to reach pt or leave a message  

## 2014-12-10 NOTE — Telephone Encounter (Signed)
[name] [name] is wanting to speak with about having  a myoview . Please call

## 2014-12-11 NOTE — Telephone Encounter (Signed)
Spoke with pt, he had an episode last week of pain between his shoulder blades that developed into chest heaviness. [facility] and was told he was not having a heart attack but needed to get another stress test. Follow up appt scheduled with dr Jens Somcrenshaw

## 2014-12-12 ENCOUNTER — Telehealth: Payer: Self-pay | Admitting: Cardiology

## 2014-12-12 NOTE — Telephone Encounter (Signed)
[facility] 12/15/14 with Dr Jens Somrenshaw.  [facility] for Dr Ludwig Clarksrenshaw's schedule on 12/15/14. lp

## 2014-12-14 NOTE — Progress Notes (Signed)
HPI: FU coronary artery disease status post coronary bypass and graft in 2004. A cardiac catheterization was performed on January 28, 2008, by Dr. Clifton James. The left main had a 50% lesion. The LAD had a 90% lesion in the proximal portion followed by a 100% occlusion after a septal perforator. The circumflex artery had plaque disease in the proximal portion. The right coronary artery was 100% occluded. The free LIMA to the LAD was patent. There was also a free radial graft that had a T-anastomosis into the LIMA. The T-graft supplied the diagonal as well as the posterior descending artery. The entire graft was patent. Also note, it reportedly ties into a marginal. His ejection fraction was 45%-50%. It was felt that medical therapy was indicated. If symptoms persisted, then PCI of the left main could be considered. Myoview in Jan 2013 showed EF 55, inferior scar, no ischemia. I last saw him in Feb 2014. Since then, The patient was seen in the emergency room in [place] last week. He developed low back pain radiating to scapula and into his chest. Enzymes were negative. He otherwise denies dyspnea, exertional chest pain or syncope.  Current Outpatient Prescriptions  Medication Sig Dispense Refill  . aspirin EC 325 MG tablet Take 325 mg by mouth daily.      Marland Kitchen atorvastatin (LIPITOR) 40 MG tablet Take 60 mg by mouth daily.      Marland Kitchen DEXILANT 60 MG capsule 1 tab daily    . ezetimibe (ZETIA) 10 MG tablet Take 10 mg by mouth daily.      Marland Kitchen HYDROcodone-acetaminophen (VICODIN) 5-500 MG per tablet As needed    . metoprolol succinate (TOPROL XL) 25 MG 24 hr tablet Take 1/2 tab daily    . Omega-3 Fatty Acids (FISH OIL) 1000 MG CAPS Take by mouth daily.      . ramipril (ALTACE) 5 MG capsule Take 5 mg by mouth daily.       No current facility-administered medications for this visit.     Past Medical History  Diagnosis Date  . HLD (hyperlipidemia)   . HTN (hypertension)   . CAD (coronary artery  disease)   . GERD (gastroesophageal reflux disease)   . PUD (peptic ulcer disease)   . IBS (irritable bowel syndrome)   . Nephrolithiasis   . Post pericardiotomy syndrome   . Fatty liver     with mildly elevated LFTs    Past Surgical History  Procedure Laterality Date  . Coronary artery bypass graft      lima as free graft to LAD, L radial artery as T graft off Lim to diagonal, OM1 and PDA  . Cholecystectomy      Social History   Social History  . Marital Status: Married    Spouse Name: N/A  . Number of Children: N/A  . Years of Education: N/A   Occupational History  . Not on file.   Social History Main Topics  . Smoking status: Former Smoker    Types: Cigarettes  . Smokeless tobacco: Not on file     Comment: quit 12 years ago  . Alcohol Use: 0.0 oz/week    0 Standard drinks or equivalent per week  . Drug Use: Not on file  . Sexual Activity: Not on file   Other Topics Concern  . Not on file   Social History Narrative    ROS: no fevers or chills, productive cough, hemoptysis, dysphasia, odynophagia, melena, hematochezia, dysuria, hematuria, rash, seizure activity, orthopnea,  PND, pedal edema, claudication. Remaining systems are negative.  Physical Exam: Well-developed well-nourished in no acute distress.  Skin is warm and dry.  HEENT is normal.  Neck is supple.  Chest is clear to auscultation with normal expansion.  Cardiovascular exam is regular rate and rhythm.  Abdominal exam nontender or distended. No masses palpated. Extremities show no edema. neuro grossly intact  ECG Sinus rhythm at a rate of 59. Inferior infarct.

## 2014-12-15 ENCOUNTER — Encounter: Payer: Self-pay | Admitting: *Deleted

## 2014-12-15 ENCOUNTER — Encounter: Payer: Self-pay | Admitting: Cardiology

## 2014-12-15 ENCOUNTER — Ambulatory Visit (INDEPENDENT_AMBULATORY_CARE_PROVIDER_SITE_OTHER): Payer: BLUE CROSS/BLUE SHIELD | Admitting: Cardiology

## 2014-12-15 DIAGNOSIS — I25810 Atherosclerosis of coronary artery bypass graft(s) without angina pectoris: Secondary | ICD-10-CM

## 2014-12-15 DIAGNOSIS — R079 Chest pain, unspecified: Secondary | ICD-10-CM | POA: Insufficient documentation

## 2014-12-15 MED ORDER — RAMIPRIL 10 MG PO CAPS: 10.0000 mg | ORAL_CAPSULE | Freq: Every day | ORAL | Status: DC

## 2014-12-15 MED ORDER — ATORVASTATIN CALCIUM 80 MG PO TABS: 80.0000 mg | ORAL_TABLET | Freq: Every day | ORAL | Status: AC

## 2014-12-15 NOTE — Assessment & Plan Note (Signed)
Symptoms somewhat atypical. Electrocardiogram with no new ST changes. Plan stress nuclear study for risk stratification.

## 2014-12-15 NOTE — Assessment & Plan Note (Signed)
Blood pressure elevated. Increase Altace to 10 mg daily. Check potassium and renal function in 4 weeks.

## 2014-12-15 NOTE — Assessment & Plan Note (Signed)
Continue aspirin and statin. 

## 2014-12-15 NOTE — Assessment & Plan Note (Signed)
Increase Lipitor to 80 mg daily. Check lipids and liver in 4 weeks. 

## 2014-12-15 NOTE — Patient Instructions (Signed)
Medication Instructions:   INCREASE RAMIPRIL TO 10 MG ONCE DAILY= 2 OF THE 5 MG TABLETS ONCE DAILY  INCREASE ATORVASTATIN TO 80 MG ONCE DAILY-= 2 OF THE 40 MG TABLETS ONCE DAILY  Labwork:  Your physician recommends that you return for lab work IN 4 WEEKS= DO NOT EAT PRIOR TO LAB WORK  Testing/Procedures:  Your physician has requested that you have en exercise stress myoview. For further information please visit [email or web address]. Please follow instruction sheet, as given.  TAKE ALL MEDICINES  Follow-Up:  Your physician wants you to follow-up in: ONE YEAR WITH DR Shelda PalRENSHAW You will receive a reminder letter in the mail two months in advance. If you don't receive a letter, please call our office to schedule the follow-up appointment.   If you need a refill on your cardiac medications before your next appointment, please call your pharmacy.

## 2014-12-16 ENCOUNTER — Telehealth (HOSPITAL_COMMUNITY): Payer: Self-pay

## 2014-12-16 NOTE — Telephone Encounter (Signed)
Encounter complete. 

## 2014-12-18 ENCOUNTER — Ambulatory Visit (HOSPITAL_COMMUNITY)
Admission: RE | Admit: 2014-12-18 | Discharge: 2014-12-18 | Disposition: A | Discharge status: Home / Self Care | Payer: BLUE CROSS/BLUE SHIELD | Source: Ambulatory Visit | Attending: Cardiovascular Disease | Admitting: Cardiovascular Disease

## 2014-12-18 DIAGNOSIS — Z87891 Personal history of nicotine dependence: Secondary | ICD-10-CM | POA: Diagnosis not present

## 2014-12-18 DIAGNOSIS — R079 Chest pain, unspecified: Secondary | ICD-10-CM | POA: Diagnosis not present

## 2014-12-18 DIAGNOSIS — I517 Cardiomegaly: Secondary | ICD-10-CM | POA: Diagnosis not present

## 2014-12-18 DIAGNOSIS — I10 Essential (primary) hypertension: Secondary | ICD-10-CM | POA: Insufficient documentation

## 2014-12-18 DIAGNOSIS — I25810 Atherosclerosis of coronary artery bypass graft(s) without angina pectoris: Secondary | ICD-10-CM | POA: Insufficient documentation

## 2014-12-18 DIAGNOSIS — R9439 Abnormal result of other cardiovascular function study: Secondary | ICD-10-CM | POA: Diagnosis not present

## 2014-12-18 LAB — MYOCARDIAL PERFUSION IMAGING
CHL CUP NUCLEAR SSS: 3
CHL CUP RESTING HR STRESS: 55 {beats}/min
CHL RATE OF PERCEIVED EXERTION: 16
CSEPED: 12 min
CSEPEDS: 31 s
CSEPEW: 13.8 METS
CSEPPHR: 157 {beats}/min
LV dias vol: 176 mL
LVSYSVOL: 94 mL
MPHR: 168 {beats}/min
NUC STRESS TID: 1.26
Percent HR: 93 %
SDS: 2
SRS: 1

## 2014-12-18 MED ORDER — TECHNETIUM TC 99M SESTAMIBI GENERIC - CARDIOLITE
10.1000 | Freq: Once | INTRAVENOUS | Status: AC | PRN
  Administered 2014-12-18: 10.1 via INTRAVENOUS

## 2014-12-18 MED ORDER — TECHNETIUM TC 99M SESTAMIBI GENERIC - CARDIOLITE
30.4000 | Freq: Once | INTRAVENOUS | Status: AC | PRN
  Administered 2014-12-18: 30.4 via INTRAVENOUS

## 2015-03-02 ENCOUNTER — Telehealth: Payer: Self-pay | Admitting: Cardiology

## 2015-03-02 ENCOUNTER — Encounter: Payer: Self-pay | Admitting: *Deleted

## 2015-03-02 NOTE — Telephone Encounter (Signed)
Left message for pt to call.

## 2015-03-02 NOTE — Telephone Encounter (Signed)
Spoke with pt, he is having back surgery. He is having surgery in [place] and will need a copy of his last office note and stress test. Records printed and okay for surgery , per dr Jens Somcrenshaw, clearance letter generated for pt to pick up.

## 2015-03-02 NOTE — Telephone Encounter (Signed)
Pt need surgical clearance,please give him a call.

## 2015-11-25 ENCOUNTER — Telehealth: Payer: Self-pay | Admitting: Cardiology

## 2015-11-25 NOTE — Telephone Encounter (Signed)
Schedule ov Olga MillersBrian Regine Christian

## 2015-11-25 NOTE — Telephone Encounter (Signed)
New message       Pt is calling to schedule his annual stress test in nov but there is no order.  Is he due to have a stress test or ov. It want the stress test before the end of the year because he was laid off and his medical is still good until 02-14-16.  Please call

## 2015-11-25 NOTE — Telephone Encounter (Signed)
Do you want to schedule an appt for pt or do the stress test and then have an appointment? Please advise

## 2015-11-25 NOTE — Telephone Encounter (Signed)
Spoke with pt, Follow up scheduled  

## 2015-12-28 NOTE — Progress Notes (Signed)
HPI: FU coronary artery disease status post coronary bypass and graft in 2004. A cardiac catheterization was performed on January 28, 2008, by Dr. Clifton JamesMcAlhany. The left main had a 50% lesion. The LAD had a 90% lesion in the proximal portion followed by a 100% occlusion after a septal perforator. The circumflex artery had plaque disease in the proximal portion. The right coronary artery was 100% occluded. The free LIMA to the LAD was patent. There was also a free radial graft that had a T-anastomosis into the LIMA. The T-graft supplied the diagonal as well as the posterior descending artery. The entire graft was patent. Also note, it reportedly ties into a marginal. His ejection fraction was 45%-50%. It was felt that medical therapy was indicated. If symptoms persisted, then PCI of the left main could be considered. Nuclear study November 2016 showed ejection fraction 47%. There was prior infarct with mild peri-infarct ischemia. Since last seen, the patient denies any dyspnea on exertion, orthopnea, PND, pedal edema, palpitations, syncope or chest pain.   Current Outpatient Prescriptions  Medication Sig Dispense Refill  . aspirin EC 325 MG tablet Take 325 mg by mouth daily.      Marland Kitchen. atorvastatin (LIPITOR) 80 MG tablet Take 1 tablet (80 mg total) by mouth daily. 90 tablet 3  . esomeprazole (NEXIUM) 20 MG packet Take 20 mg by mouth 2 (two) times daily.    Marland Kitchen. ezetimibe (ZETIA) 10 MG tablet Take 10 mg by mouth daily.      Marland Kitchen. HYDROcodone-acetaminophen (VICODIN) 5-500 MG per tablet As needed    . methocarbamol (ROBAXIN) 500 MG tablet Take 500 mg by mouth as directed.    . metoprolol succinate (TOPROL XL) 25 MG 24 hr tablet Take 25 mg by mouth 2 (two) times daily. Take 1/2 tab daily    . Omega-3 Fatty Acids (FISH OIL) 1000 MG CAPS Take by mouth daily.      . ramipril (ALTACE) 10 MG capsule Take 1 capsule (10 mg total) by mouth daily. 90 capsule 3   No current facility-administered medications for this  visit.      Past Medical History:  Diagnosis Date  . CAD (coronary artery disease)   . Fatty liver    with mildly elevated LFTs  . GERD (gastroesophageal reflux disease)   . HLD (hyperlipidemia)   . HTN (hypertension)   . IBS (irritable bowel syndrome)   . Nephrolithiasis   . Post pericardiotomy syndrome   . PUD (peptic ulcer disease)     Past Surgical History:  Procedure Laterality Date  . CHOLECYSTECTOMY    . CORONARY ARTERY BYPASS GRAFT     lima as free graft to LAD, L radial artery as T graft off Lim to diagonal, OM1 and PDA    Social History   Social History  . Marital status: Married    Spouse name: N/A  . Number of children: N/A  . Years of education: N/A   Occupational History  . Not on file.   Social History Main Topics  . Smoking status: Former Smoker    Types: Cigarettes  . Smokeless tobacco: Not on file     Comment: quit 12 years ago  . Alcohol use 0.0 oz/week  . Drug use: Unknown  . Sexual activity: Not on file   Other Topics Concern  . Not on file   Social History Narrative  . No narrative on file    History reviewed. No pertinent family history.  ROS: back  pain but no fevers or chills, productive cough, hemoptysis, dysphasia, odynophagia, melena, hematochezia, dysuria, hematuria, rash, seizure activity, orthopnea, PND, pedal edema, claudication. Remaining systems are negative.  Physical Exam: Well-developed well-nourished in no acute distress.  Skin is warm and dry.  HEENT is normal.  Neck is supple.  Chest is clear to auscultation with normal expansion.  Cardiovascular exam is regular rate and rhythm.  Abdominal exam nontender or distended. No masses palpated. Extremities show no edema. neuro grossly intact  ECG-Normal sinus rhythm at a rate of 65. Cannot rule out prior inferior infarct.  A/P  1 Hyperlipidemia-continue statin. Check lipids and liver.  2 hypertension-blood pressure elevated. Add amlodipine 5 mg daily and follow.     3 Conary artery disease-continue aspirin and statin.  Olga MillersBrian Lowella Kindley, MD

## 2016-01-04 ENCOUNTER — Encounter: Payer: Self-pay | Admitting: Cardiology

## 2016-01-04 ENCOUNTER — Ambulatory Visit (INDEPENDENT_AMBULATORY_CARE_PROVIDER_SITE_OTHER): Payer: BLUE CROSS/BLUE SHIELD | Admitting: Cardiology

## 2016-01-04 VITALS — BP 180/98 | HR 65 | Ht 72.0 in | Wt 227.4 lb

## 2016-01-04 DIAGNOSIS — I2510 Atherosclerotic heart disease of native coronary artery without angina pectoris: Secondary | ICD-10-CM

## 2016-01-04 DIAGNOSIS — E7800 Pure hypercholesterolemia, unspecified: Secondary | ICD-10-CM

## 2016-01-04 DIAGNOSIS — I25810 Atherosclerosis of coronary artery bypass graft(s) without angina pectoris: Secondary | ICD-10-CM

## 2016-01-04 DIAGNOSIS — I10 Essential (primary) hypertension: Secondary | ICD-10-CM

## 2016-01-04 LAB — LIPID PANEL
CHOLESTEROL: 125 mg/dL (ref <200)
HDL: 38 mg/dL — AB (ref >40)
LDL CALC: 63 mg/dL (ref <100)
TRIGLYCERIDES: 122 mg/dL (ref <150)
Total CHOL/HDL Ratio: 3.3 Ratio (ref <5.0)
VLDL: 24 mg/dL (ref <30)

## 2016-01-04 LAB — BASIC METABOLIC PANEL
BUN: 13 mg/dL (ref 7–25)
CO2: 29 mmol/L (ref 20–31)
CREATININE: 0.7 mg/dL (ref 0.70–1.33)
Calcium: 9.7 mg/dL (ref 8.6–10.3)
Chloride: 101 mmol/L (ref 98–110)
Glucose, Bld: 100 mg/dL — ABNORMAL HIGH (ref 65–99)
Potassium: 4.4 mmol/L (ref 3.5–5.3)
Sodium: 140 mmol/L (ref 135–146)

## 2016-01-04 LAB — HEPATIC FUNCTION PANEL
ALBUMIN: 4.8 g/dL (ref 3.6–5.1)
ALK PHOS: 54 U/L (ref 40–115)
ALT: 70 U/L — AB (ref 9–46)
AST: 68 U/L — ABNORMAL HIGH (ref 10–35)
BILIRUBIN INDIRECT: 1.9 mg/dL — AB (ref 0.2–1.2)
Bilirubin, Direct: 0.4 mg/dL — ABNORMAL HIGH (ref <=0.2)
TOTAL PROTEIN: 7 g/dL (ref 6.1–8.1)
Total Bilirubin: 2.3 mg/dL — ABNORMAL HIGH (ref 0.2–1.2)

## 2016-01-04 MED ORDER — AMLODIPINE BESYLATE 5 MG PO TABS: 5.0000 mg | ORAL_TABLET | Freq: Every day | ORAL | 3 refills | Status: DC

## 2016-01-04 NOTE — Patient Instructions (Signed)
Medication Instructions:   START AMLODIPINE 5 MG ONCE DAILY  Labwork:  Your physician recommends that you HAVE LAB WORK TODAY  Follow-Up:  Your physician wants you to follow-up in: ONE YEAR WITH DR CRENSHAW You will receive a reminder letter in the mail two months in advance. If you don't receive a letter, please call our office to schedule the follow-up appointment.   If you need a refill on your cardiac medications before your next appointment, please call your pharmacy.    

## 2016-01-05 ENCOUNTER — Telehealth: Payer: Self-pay | Admitting: *Deleted

## 2016-01-05 DIAGNOSIS — R7989 Other specified abnormal findings of blood chemistry: Secondary | ICD-10-CM

## 2016-01-05 DIAGNOSIS — R945 Abnormal results of liver function studies: Principal | ICD-10-CM

## 2016-01-05 NOTE — Telephone Encounter (Signed)
-----   Message from Lewayne BuntingBrian S Crenshaw, MD sent at 01/05/2016  5:02 AM EST ----- Repeat lfts 12 weeks Olga MillersBrian Crenshaw

## 2016-01-05 NOTE — Telephone Encounter (Signed)
Left detailed message for patient of lab results and the need to recheck liver function in 12 weeks. Lab orders mailed to the pt

## 2016-01-25 ENCOUNTER — Other Ambulatory Visit: Payer: Self-pay | Admitting: Cardiology

## 2016-01-25 NOTE — Telephone Encounter (Signed)
REFILL 

## 2016-03-28 ENCOUNTER — Other Ambulatory Visit: Payer: Self-pay | Admitting: Neurological Surgery

## 2016-03-28 ENCOUNTER — Other Ambulatory Visit (HOSPITAL_COMMUNITY): Payer: Self-pay | Admitting: Neurological Surgery

## 2016-03-28 DIAGNOSIS — M4316 Spondylolisthesis, lumbar region: Secondary | ICD-10-CM

## 2016-04-07 ENCOUNTER — Ambulatory Visit (HOSPITAL_COMMUNITY)
Admission: RE | Admit: 2016-04-07 | Discharge: 2016-04-07 | Disposition: A | Discharge status: Home / Self Care | Payer: Commercial Managed Care - PPO | Source: Ambulatory Visit | Attending: Neurological Surgery | Admitting: Neurological Surgery

## 2016-04-07 DIAGNOSIS — M4316 Spondylolisthesis, lumbar region: Secondary | ICD-10-CM | POA: Diagnosis present

## 2016-04-07 DIAGNOSIS — M5137 Other intervertebral disc degeneration, lumbosacral region: Secondary | ICD-10-CM | POA: Insufficient documentation

## 2016-04-07 DIAGNOSIS — M5136 Other intervertebral disc degeneration, lumbar region: Secondary | ICD-10-CM | POA: Insufficient documentation

## 2016-04-07 DIAGNOSIS — Z981 Arthrodesis status: Secondary | ICD-10-CM | POA: Diagnosis not present

## 2016-04-07 DIAGNOSIS — M48061 Spinal stenosis, lumbar region without neurogenic claudication: Secondary | ICD-10-CM | POA: Diagnosis not present

## 2016-04-07 MED ORDER — OXYCODONE-ACETAMINOPHEN 5-325 MG PO TABS
ORAL_TABLET | ORAL | Status: AC
  Administered 2016-04-07: 1
  Filled 2016-04-07: qty 1

## 2016-04-07 MED ORDER — DIAZEPAM 5 MG PO TABS
ORAL_TABLET | ORAL | Status: AC
  Administered 2016-04-07: 10 mg via ORAL
  Filled 2016-04-07: qty 1

## 2016-04-07 MED ORDER — ONDANSETRON HCL 4 MG/2ML IJ SOLN: 4.0000 mg | Freq: Four times a day (QID) | INTRAMUSCULAR | Status: DC | PRN

## 2016-04-07 MED ORDER — DIAZEPAM 5 MG PO TABS
10.0000 mg | ORAL_TABLET | Freq: Once | ORAL | Status: AC
  Administered 2016-04-07: 10 mg via ORAL

## 2016-04-07 MED ORDER — LIDOCAINE HCL 1 % IJ SOLN
INTRAMUSCULAR | Status: AC
  Filled 2016-04-07: qty 10

## 2016-04-07 MED ORDER — IOPAMIDOL (ISOVUE-M 200) INJECTION 41%
20.0000 mL | Freq: Once | INTRAMUSCULAR | Status: AC
  Administered 2016-04-07: 11 mL via INTRATHECAL

## 2016-04-07 MED ORDER — OXYCODONE-ACETAMINOPHEN 5-325 MG PO TABS
ORAL_TABLET | ORAL | Status: AC
  Filled 2016-04-07: qty 1

## 2016-04-07 MED ORDER — IOPAMIDOL (ISOVUE-M 200) INJECTION 41%
INTRAMUSCULAR | Status: AC
  Administered 2016-04-07: 11 mL via INTRATHECAL
  Filled 2016-04-07: qty 10

## 2016-04-07 MED ORDER — DEXAMETHASONE 4 MG PO TABS
4.0000 mg | ORAL_TABLET | Freq: Once | ORAL | Status: AC
  Administered 2016-04-07: 4 mg via ORAL
  Filled 2016-04-07: qty 1

## 2016-04-07 MED ORDER — LIDOCAINE HCL (PF) 1 % IJ SOLN
5.0000 mL | Freq: Once | INTRAMUSCULAR | Status: AC
  Administered 2016-04-07: 5 mL

## 2016-04-07 MED ORDER — OXYCODONE-ACETAMINOPHEN 5-325 MG PO TABS
1.0000 | ORAL_TABLET | ORAL | Status: DC | PRN
  Administered 2016-04-07: 1 via ORAL
  Filled 2016-04-07: qty 2

## 2016-04-07 NOTE — Discharge Instructions (Signed)
Myelography, Care After °These instructions give you information on caring for yourself after your procedure. Your doctor may also give you more specific instructions. Call your doctor if you have any problems or questions after your procedure. °HOME CARE °· Rest the first day. °· When you rest, lie flat, with your head slightly raised (elevated). °· Avoid heavy lifting and activity for 48 hours, or as told by your doctor. °· You may take the bandage (dressing) off one day after the test, or as told by your doctor. °· Take all medicines only as told by your doctor. °· Ask your doctor when it is okay to take a shower or bath. °· Ask your doctor when your test results will be ready and how you can get them. Make sure you follow up and get your results. °· Do not drink alcohol for 24 hours, or as told by your doctor. °· Drink enough fluid to keep your pee (urine) clear or pale yellow. °GET HELP IF:  °· You have a fever. °· You have a headache. °· You feel sick to your stomach (nauseous) or throw up (vomit). °· You have pain or cramping in your belly (abdomen). °GET HELP RIGHT AWAY IF:  °· You have a headache with a stiff neck or fever. °· You have trouble breathing. °· Any of the places where the needles were put in are: °¨ Puffy (swollen) or red. °¨ Sore or hot to the touch. °¨ Draining yellowish-white fluid (pus). °¨ Bleeding. °MAKE SURE YOU: °· Understand these instructions. °· Will watch your condition. °· Will get help right away if you are not doing well or get worse. °This information is not intended to replace advice given to you by your health care provider. Make sure you discuss any questions you have with your health care provider. °Document Released: 11/10/2007 Document Revised: 02/21/2014 Document Reviewed: 11/13/2014 °Elsevier Interactive Patient Education © 2017 [employer] ° °

## 2016-04-07 NOTE — Procedures (Signed)
[name] is a 56 year old individual who had surgery to decompress stenosis at L4-L5 little over a year ago [facility] [place].Had some persistent pain continues to radiate both lower extremities but worse on the left than on the right he has evidence of a bulging disc at level of L4-L5 and I recommended a myelogram and postmyelogram CAT scan to better evaluate his anatomy in the lower lumbar region.  Pre op Dx: Lumbar spondylosis L4-L5 with lumbar radiculopathy Post op Dx: Lumbar spondylosis with lumbar radiculopathy status post laminectomy L4-L5 Procedure: Lumbar myelogram Surgeon: Torrin Crihfield Puncture level: L3-4 Fluid color: Clear colorless Injection: Isovue 200, 11 mL Findings: Moderate stenosis L4-L5 with broad-based bulge of the disc. Further evaluation with CT scanning. More left-sided than right

## 2017-07-04 ENCOUNTER — Encounter: Payer: Self-pay | Admitting: Physician Assistant

## 2017-07-04 ENCOUNTER — Ambulatory Visit: Payer: Commercial Managed Care - PPO | Admitting: Physician Assistant

## 2017-07-04 VITALS — BP 138/82 | HR 70 | Ht 72.0 in | Wt 245.0 lb

## 2017-07-04 DIAGNOSIS — E785 Hyperlipidemia, unspecified: Secondary | ICD-10-CM | POA: Diagnosis not present

## 2017-07-04 DIAGNOSIS — R079 Chest pain, unspecified: Secondary | ICD-10-CM | POA: Diagnosis not present

## 2017-07-04 DIAGNOSIS — I10 Essential (primary) hypertension: Secondary | ICD-10-CM | POA: Diagnosis not present

## 2017-07-04 DIAGNOSIS — I25709 Atherosclerosis of coronary artery bypass graft(s), unspecified, with unspecified angina pectoris: Secondary | ICD-10-CM | POA: Diagnosis not present

## 2017-07-04 NOTE — Progress Notes (Signed)
Cardiology Office Note    Date:  07/04/2017   ID:  [name] [name], DOB 11/15/1960, MRN [id number]  PCP:  Etta Quill, PA-C  Cardiologist:  Dr. Jens Som  Chief Complaint  Patient presents with  . Follow-up    seen for Dr. Jens Som.     History of Present Illness:  [name] [name] is a 57 y.o. male with PMH of CAD s/p CABG 2004, fatty liver with mildly elevated LFT, HTN, HLD, ISB, PUD and post pericardiotomy syndrome.  Cardiac catheterization performed on 01/28/2008 showed 50% left main lesion, 90% proximal LAD lesion followed by 100% occlusion after septal perforator, 100% occluded RCA, patent LIMA to LAD, free radial graft had T anastomosis into the LIMA, the T graft supplies the diagonal as well as posterior descending artery, EF 45 to 50%.  Medical therapy was recommended however if symptoms persist may consider PCI of left main.  Nuclear study in November 2016 showed EF 47%, prior infarct with mild peri-infarct ischemia.  Patient was last seen by Dr. Jens Som in November 2017, blood pressure was elevated at the time, amlodipine 5 mg daily was added.  Since his last office visit, his amlodipine has been discontinued.  His ramipril has also been switched to losartan-HCTZ.  For the past several weeks, he has been having this pressure on the left side of the chest.  He denies any significant shortness of breath however he does have occasional dizziness and also numbness in his bilateral hand and along with swelling.  Yesterday, he was [facility] due to dizziness and blood pressure in the 200 range.  Troponin T was negative, he was discharged to follow-up with cardiology as outpatient.  His chest discomfort does not seems to occur with exertion, however given his prior history, I recommended a treadmill Myoview.  He is aware that he will need to hold the Toprol-XL tonight before the stress test.  As far as his blood pressure, I think one of the major  reason his blood pressures tend to fluctuate is he take both losartan-HCTZ and the Toprol-XL at the same time, I asked him to take losartan-HCTZ in the morning and Toprol-XL at night.  If his blood pressure remain elevated, Toprol-XL can be increased to 100 mg daily.  Otherwise he can follow-up with Dr. Jens Som in 6 months.   Past Medical History:  Diagnosis Date  . CAD (coronary artery disease)   . Fatty liver    with mildly elevated LFTs  . GERD (gastroesophageal reflux disease)   . HLD (hyperlipidemia)   . HTN (hypertension)   . IBS (irritable bowel syndrome)   . Nephrolithiasis   . Post pericardiotomy syndrome   . PUD (peptic ulcer disease)     Past Surgical History:  Procedure Laterality Date  . CHOLECYSTECTOMY    . CORONARY ARTERY BYPASS GRAFT     lima as free graft to LAD, L radial artery as T graft off Lim to diagonal, OM1 and PDA    Current Medications: Outpatient Medications Prior to Visit  Medication Sig Dispense Refill  . aspirin EC 325 MG tablet Take 325 mg by mouth daily at 12 noon.     Marland Kitchen atorvastatin (LIPITOR) 80 MG tablet Take 1 tablet (80 mg total) by mouth daily. 90 tablet 3  . dexlansoprazole (DEXILANT) 60 MG capsule Take 1 tablet by mouth every morning.    Marland Kitchen esomeprazole (NEXIUM) 20 MG capsule Take 40 mg by mouth daily  at 12 noon.    . ezetimibe (ZETIA) 10 MG tablet Take 10 mg by mouth at bedtime.     Marland Kitchen HYDROcodone-acetaminophen (NORCO/VICODIN) 5-325 MG tablet 1 tablet 3 (three) times daily as needed for pain. As needed    . losartan-hydrochlorothiazide (HYZAAR) 100-25 MG tablet Take 1 tablet by mouth every morning.  0  . methocarbamol (ROBAXIN) 500 MG tablet Take 500 mg by mouth every 8 (eight) hours as needed for muscle spasms.     . metoprolol succinate (TOPROL-XL) 50 MG 24 hr tablet Take 50 mg by mouth daily at 12 noon. Take with or immediately following a meal.     . valsartan-hydrochlorothiazide (DIOVAN-HCT) 320-25 MG tablet Take 1 tablet by mouth daily  at 12 noon.     . Lidocaine 4 % PTCH Apply 1 patch topically daily as needed.     No facility-administered medications prior to visit.      Allergies:   Hydrocodone-guaifenesin   Social History   Socioeconomic History  . Marital status: Married    Spouse name: Not on file  . Number of children: Not on file  . Years of education: Not on file  . Highest education level: Not on file  Occupational History  . Not on file  Social Needs  . Financial resource strain: Not on file  . Food insecurity:    Worry: Not on file    Inability: Not on file  . Transportation needs:    Medical: Not on file    Non-medical: Not on file  Tobacco Use  . Smoking status: Former Smoker    Types: Cigarettes  . Smokeless tobacco: Never Used  . Tobacco comment: quit 12 years ago  Substance and Sexual Activity  . Alcohol use: Yes    Alcohol/week: 0.0 oz  . Drug use: Not on file  . Sexual activity: Not on file  Lifestyle  . Physical activity:    Days per week: Not on file    Minutes per session: Not on file  . Stress: Not on file  Relationships  . Social connections:    Talks on phone: Not on file    Gets together: Not on file    Attends religious service: Not on file    Active member of club or organization: Not on file    Attends meetings of clubs or organizations: Not on file    Relationship status: Not on file  Other Topics Concern  . Not on file  Social History Narrative  . Not on file     Family History:  The patient's family history is not on file.   ROS:   Please see the history of present illness.    ROS All other systems reviewed and are negative.   PHYSICAL EXAM:   VS:  BP 138/82   Pulse 70   Ht 6' (1.829 m)   Wt 245 lb (111.1 kg)   BMI 33.23 kg/m    GEN: Well nourished, well developed, in no acute distress  HEENT: normal  Neck: no JVD, carotid bruits, or masses Cardiac: RRR; no murmurs, rubs, or gallops,no edema  Respiratory:  clear to auscultation bilaterally,  normal work of breathing GI: soft, nontender, nondistended, + BS MS: no deformity or atrophy  Skin: warm and dry, no rash Neuro:  Alert and Oriented x 3, Strength and sensation are intact Psych: euthymic mood, full affect  Wt Readings from Last 3 Encounters:  07/04/17 245 lb (111.1 kg)  04/07/16 219 lb (99.3  kg)  01/04/16 227 lb 6.4 oz (103.1 kg)      Studies/Labs Reviewed:   EKG:  EKG is ordered today.  The ekg ordered today demonstrates normal sinus rhythm with PVCs.  T wave inversion in lead V1 and V2 unchanged when compared to previous EKG  Recent Labs: No results found for requested labs within last 8760 hours.   Lipid Panel    Component Value Date/Time   CHOL 125 01/04/2016 0937   TRIG 122 01/04/2016 0937   HDL 38 (L) 01/04/2016 0937   CHOLHDL 3.3 01/04/2016 0937   VLDL 24 01/04/2016 0937   LDLCALC 63 01/04/2016 0937    Additional studies/ records that were reviewed today include:  Cath 01/28/2008   ANGIOGRAPHIC FINDINGS:  1. The left main coronary artery has a long diffuse segment of 50%      narrowing extending from the ostium to the distal portion of distal      segment.  2. The left anterior descending has severe 90% lesions in the proximal      portion followed by 100% occlusion after a septal perforator in the      midportion.  The mid and distal LAD fills from the bypass graft.      There is also a large diagonal branch that fills from the bypass      graft.  3. The circumflex artery has plaque disease noted in the proximal      portion and gives off several large marginal branches that have      plaque disease noted.  The AV groove circumflex vessel is small to      moderate sized vessel that has plaque disease.  4. The right coronary artery has a 100% proximal occlusion just past      the ostium.  The distal right coronary artery as well as the      posterior descending artery and a posterolateral segment fill from      the bypass graft.  5. There is  a free left internal mammary artery graft that ties into      the mid LAD.  There is also a free radial artery graft that has a T-      anastomosis into the left internal mammary artery graft.  His T-      graft supplies the diagonal vessel as well as the posterior      descending artery.  This entire graft is patent.  This is      reportedly also tied into a marginal branch which is not seen on      angiography.  6. The left ventricular angiogram demonstrates inferoapical and      anteroapical hypokineses with an overall ejection fraction of 45-      50%.   HEMODYNAMIC DATA:  Left ventricular pressure 119/12, end-diastolic  pressure 15, and central aortic pressure 117/67.   IMPRESSION:  1. Severe double-vessel coronary artery disease with moderate left      main stenosis, status post three-vessel coronary artery bypass      grafting with 3/3 patent graft.  2. Mild segmental left ventricular dysfunction.   ASSESSMENT:    1. Chest pain, unspecified type   2. Coronary artery disease involving coronary bypass graft of native heart with angina pectoris (HCC)   3. Essential hypertension   4. Hyperlipidemia, unspecified hyperlipidemia type      PLAN:  In order of problems listed above:  1. Chest pain: Does not seems to occur with  exertion, troponin T was negative yesterday.  I would recommend a treadmill Myoview for further assessment  2. CAD: s/p CABG x3.  Last cardiac catheterization was in December 2009 which showed patent grafts x3, moderate left main disease.  Continue aspirin and Lipitor along with Zetia.  It is unclear to me why he is on 325 mg aspirin instead of 81 mg aspirin.  However it appears he has been on this dose for years.  This aspirin dose will need to be reassessed on follow-up  3. Hypertension: Blood pressure was elevated yesterday in the emergency room, his blood pressure today is in the 130s, I asked him to switch his losartan-HCTZ to AM and Toprol-XL to p.m.   If his blood pressure remain elevated on follow-up, would recommend he increase Toprol-XL to 75 mg daily.  4. Hyperlipidemia: Continue Lipitor and Zetia.  Lipid panel obtained by PCP    Medication Adjustments/Labs and Tests Ordered: Current medicines are reviewed at length with the patient today.  Concerns regarding medicines are outlined above.  Medication changes, Labs and Tests ordered today are listed in the Patient Instructions below. Patient Instructions  Medication Instructions:  TAKE Losartan Hctz in the mornings  TAKE Toprol in the evenings  Labwork: none  Testing/Procedures: Your physician has requested that you have an treadmill stress myoview. For further information please visit [email or web address] Please follow instruction sheet, as given.    Follow-Up: Your physician wants you to follow-up in: 6 months with Dr Jens Som. You will receive a reminder letter in the mail two months in advance. If you don't receive a letter, please call our office to schedule the follow-up appointment.  Any Other Special Instructions Will Be Listed Below (If Applicable).  If you need a refill on your cardiac medications before your next appointment, please call your pharmacy.     Ramond Dial, [place]  07/04/2017 5:34 PM    [facility] [address] [place], [place], [place]  [address] Phone: [phone]; Fax: [phone]

## 2017-07-04 NOTE — Patient Instructions (Addendum)
Medication Instructions:  TAKE Losartan Hctz in the mornings  TAKE Toprol in the evenings  Labwork: none  Testing/Procedures: Your physician has requested that you have an treadmill stress myoview. For further information please visit [email or web address] Please follow instruction sheet, as given.    Follow-Up: Your physician wants you to follow-up in: 6 months with Dr Jens Som. You will receive a reminder letter in the mail two months in advance. If you don't receive a letter, please call our office to schedule the follow-up appointment.  Any Other Special Instructions Will Be Listed Below (If Applicable).  If you need a refill on your cardiac medications before your next appointment, please call your pharmacy.

## 2017-07-07 ENCOUNTER — Encounter (HOSPITAL_COMMUNITY): Payer: Commercial Managed Care - PPO

## 2017-07-12 ENCOUNTER — Telehealth (HOSPITAL_COMMUNITY): Payer: Self-pay

## 2017-07-12 NOTE — Telephone Encounter (Signed)
Encounter complete. 

## 2017-07-13 ENCOUNTER — Telehealth (HOSPITAL_COMMUNITY): Payer: Self-pay

## 2017-07-13 NOTE — Telephone Encounter (Signed)
Encounter complete. 

## 2017-07-14 ENCOUNTER — Inpatient Hospital Stay (HOSPITAL_COMMUNITY): Admission: RE | Admit: 2017-07-14 | Payer: Commercial Managed Care - PPO | Source: Ambulatory Visit

## 2017-07-14 ENCOUNTER — Telehealth (HOSPITAL_COMMUNITY): Payer: Self-pay

## 2017-07-14 NOTE — Telephone Encounter (Signed)
Encounter complete. 

## 2017-07-19 ENCOUNTER — Telehealth (HOSPITAL_COMMUNITY): Payer: Self-pay

## 2017-07-19 NOTE — Telephone Encounter (Signed)
Encounter complete. 

## 2017-07-21 ENCOUNTER — Telehealth (HOSPITAL_COMMUNITY): Payer: Self-pay

## 2017-07-21 NOTE — Telephone Encounter (Signed)
Encounter complete. 

## 2017-07-25 ENCOUNTER — Ambulatory Visit (HOSPITAL_COMMUNITY)
Admission: RE | Admit: 2017-07-25 | Discharge: 2017-07-25 | Disposition: A | Discharge status: Home / Self Care | Payer: Commercial Managed Care - PPO | Source: Ambulatory Visit | Attending: Cardiovascular Disease | Admitting: Cardiovascular Disease

## 2017-07-25 DIAGNOSIS — I10 Essential (primary) hypertension: Secondary | ICD-10-CM | POA: Diagnosis not present

## 2017-07-25 DIAGNOSIS — Z951 Presence of aortocoronary bypass graft: Secondary | ICD-10-CM | POA: Diagnosis not present

## 2017-07-25 DIAGNOSIS — R079 Chest pain, unspecified: Secondary | ICD-10-CM | POA: Insufficient documentation

## 2017-07-25 DIAGNOSIS — Z87891 Personal history of nicotine dependence: Secondary | ICD-10-CM | POA: Diagnosis not present

## 2017-07-25 DIAGNOSIS — K219 Gastro-esophageal reflux disease without esophagitis: Secondary | ICD-10-CM | POA: Insufficient documentation

## 2017-07-25 DIAGNOSIS — I2510 Atherosclerotic heart disease of native coronary artery without angina pectoris: Secondary | ICD-10-CM | POA: Diagnosis not present

## 2017-07-25 LAB — MYOCARDIAL PERFUSION IMAGING
CHL CUP RESTING HR STRESS: 85 {beats}/min
CSEPPHR: 100 {beats}/min
LVDIAVOL: 132 mL (ref 62–150)
LVSYSVOL: 56 mL
SDS: 1
SRS: 0
SSS: 1
TID: 1.07

## 2017-07-25 MED ORDER — TECHNETIUM TC 99M TETROFOSMIN IV KIT
9.4000 | PACK | Freq: Once | INTRAVENOUS | Status: AC | PRN
  Administered 2017-07-25: 9.4 via INTRAVENOUS
  Filled 2017-07-25: qty 10

## 2017-07-25 MED ORDER — REGADENOSON 0.4 MG/5ML IV SOLN
0.4000 mg | Freq: Once | INTRAVENOUS | Status: AC
  Administered 2017-07-25: 0.4 mg via INTRAVENOUS

## 2017-07-25 MED ORDER — AMINOPHYLLINE 25 MG/ML IV SOLN
75.0000 mg | Freq: Once | INTRAVENOUS | Status: AC
Start: 2017-07-25 — End: 2017-07-25
  Administered 2017-07-25: 75 mg via INTRAVENOUS

## 2017-07-25 MED ORDER — TECHNETIUM TC 99M TETROFOSMIN IV KIT
31.7000 | PACK | Freq: Once | INTRAVENOUS | Status: AC | PRN
  Administered 2017-07-25: 31.7 via INTRAVENOUS
  Filled 2017-07-25: qty 32

## 2017-07-26 ENCOUNTER — Telehealth: Payer: Self-pay

## 2017-07-26 NOTE — Telephone Encounter (Signed)
I have called and informed the patient stress test result. He also mentions he has been on 325mg  aspirin since his CABG, I will defer to Dr. Jens Somrenshaw to consider reduce his ASA to 81 mg on the next followup

## 2017-07-26 NOTE — Telephone Encounter (Signed)
Contact patient to discuss stress test results. Informed patient his results were normal but he wanted a more detailed explanation. [facility].   Wynema BirchHao agreeable to contact patient after clinic today. Patient voiced understanding.

## 2018-06-04 DIAGNOSIS — I739 Peripheral vascular disease, unspecified: Secondary | ICD-10-CM | POA: Insufficient documentation

## 2018-08-03 IMAGING — RF DG MYELOGRAM LUMBAR
12 series · 12 of 12 positions shown · non-contrast
Comparison: MRI of the lumbar spine 05/06/2015

CLINICAL DATA: Low back pain extending along the posterior aspect
of both lower extremities to caps, left greater than right. Previous
lumbar spine surgery.
TECHNIQUE: Contiguous axial images were obtained through the Lumbar spine after
the intrathecal infusion of infusion. Coronal and sagittal
reconstructions were obtained of the axial image sets.

[Series 1: cp_standard · 0.28mm/px · 1 of 1 slices shown (1 of 2)]
[im 1/1]
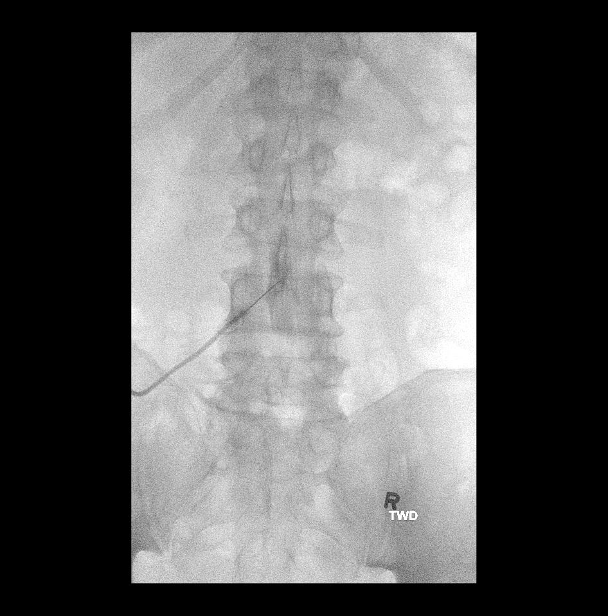

[Series 2: cp_standard · 0.28mm/px · 1 of 1 slices shown (2 of 2)]
[im 1/1]
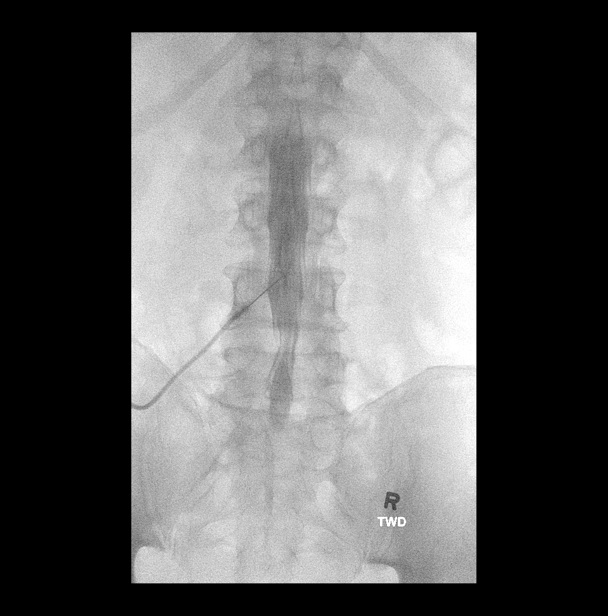

[Series 3: fluoro_myelogram_singleshot_bw · 0.19mm/px · 1 of 1 slices shown (1 of 10)]
[im 1/1]
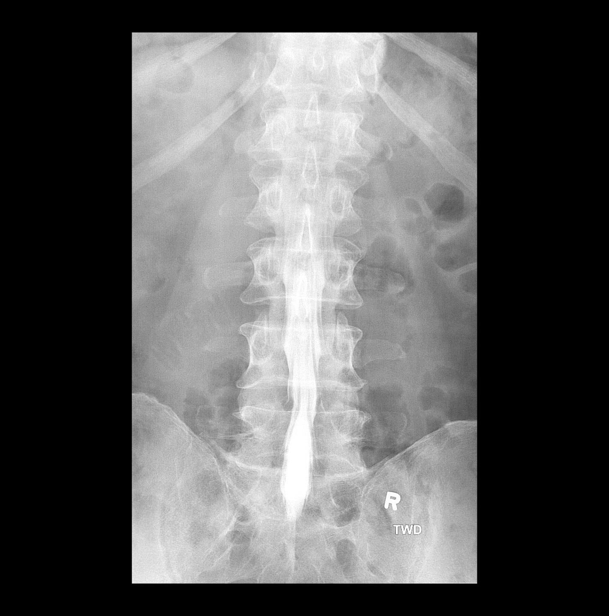

[Series 4: fluoro_myelogram_singleshot_bw · 0.18mm/px · 1 of 1 slices shown (2 of 10)]
[im 1/1]
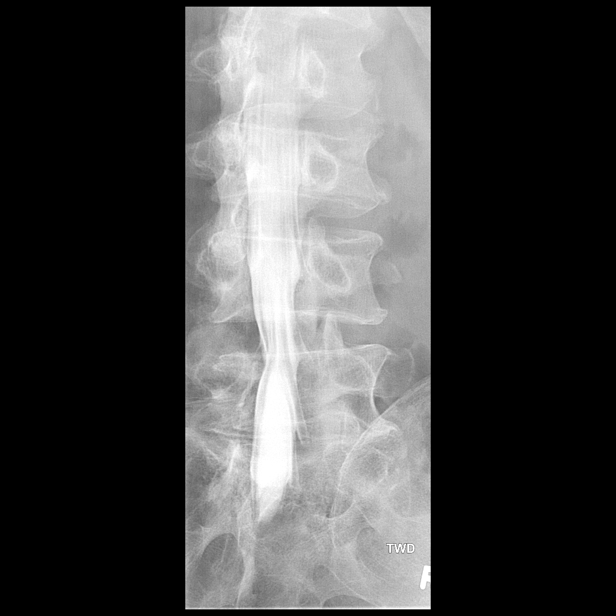

[Series 5: fluoro_myelogram_singleshot_bw · 0.18mm/px · 1 of 1 slices shown (3 of 10)]
[im 1/1]
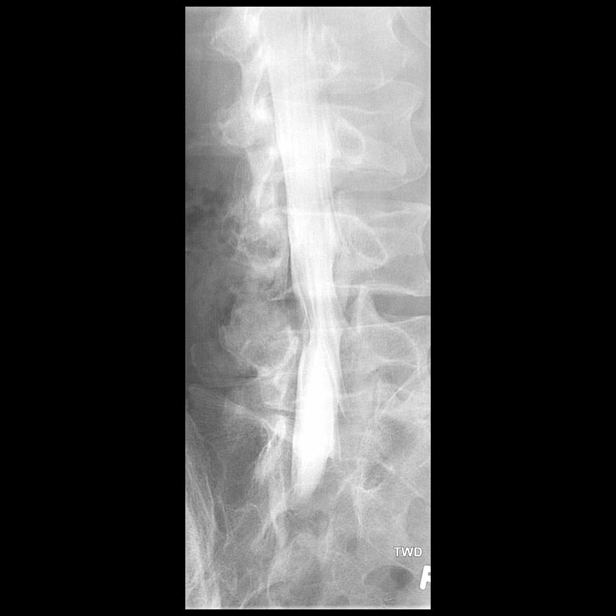

[Series 6: fluoro_myelogram_singleshot_bw · 0.18mm/px · 1 of 1 slices shown (4 of 10)]
[im 1/1]
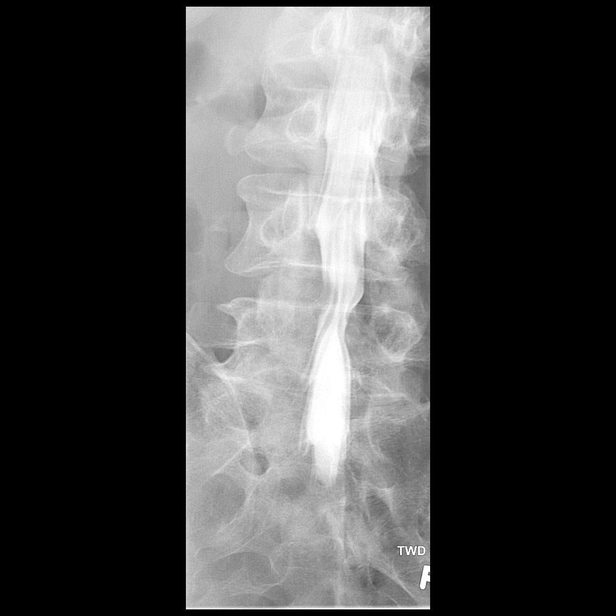

[Series 7: fluoro_myelogram_singleshot_bw · 0.18mm/px · 1 of 1 slices shown (5 of 10)]
[im 1/1]
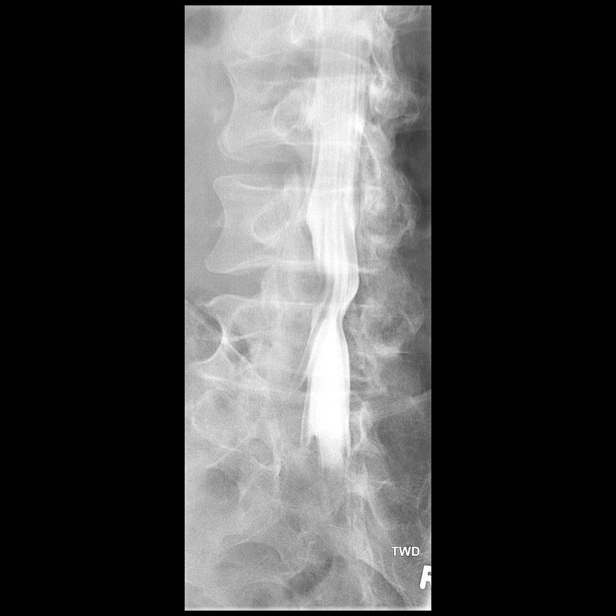

[Series 8: fluoro_myelogram_singleshot_bw · 0.17mm/px · 1 of 1 slices shown (6 of 10)]
[im 1/1]
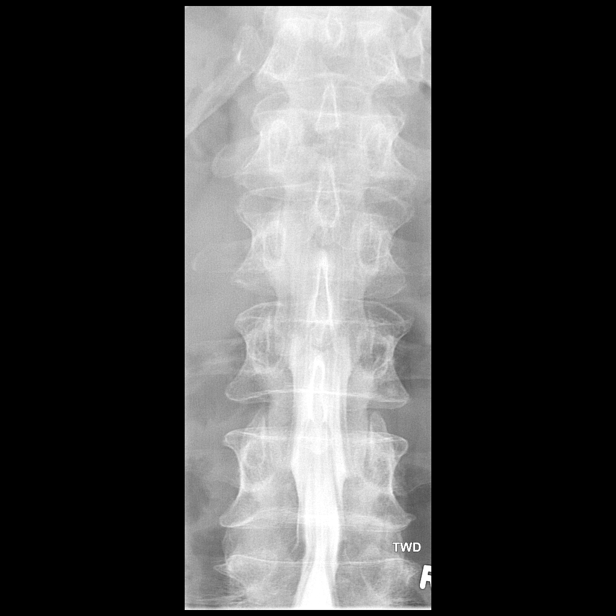

[Series 9: fluoro_myelogram_singleshot_bw · 0.17mm/px · 1 of 1 slices shown (7 of 10)]
[im 1/1]
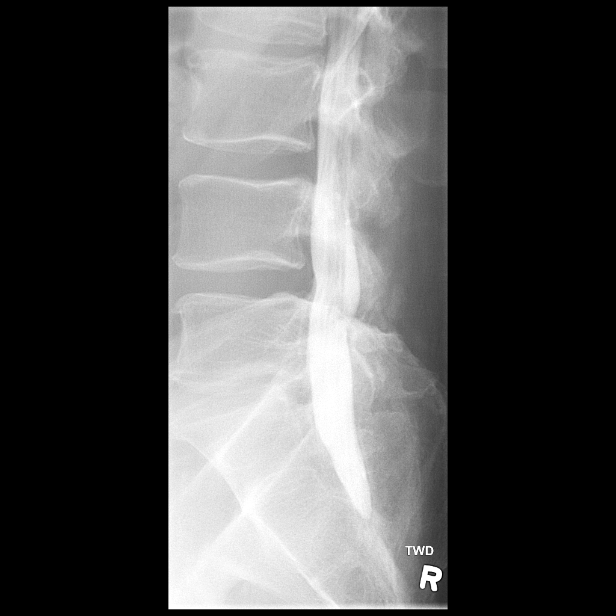

[Series 10: fluoro_myelogram_singleshot_bw · 0.18mm/px · 1 of 1 slices shown (8 of 10)]
[im 1/1]
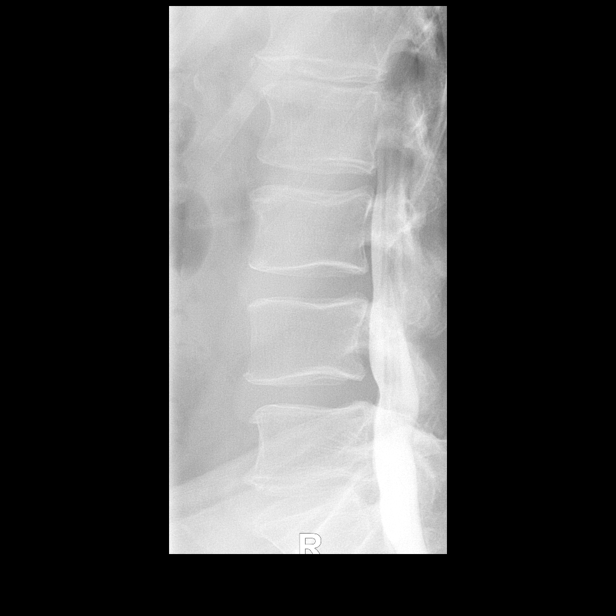

[Series 11: fluoro_myelogram_singleshot_bw · 0.17mm/px · 1 of 1 slices shown (9 of 10)]
[im 1/1]
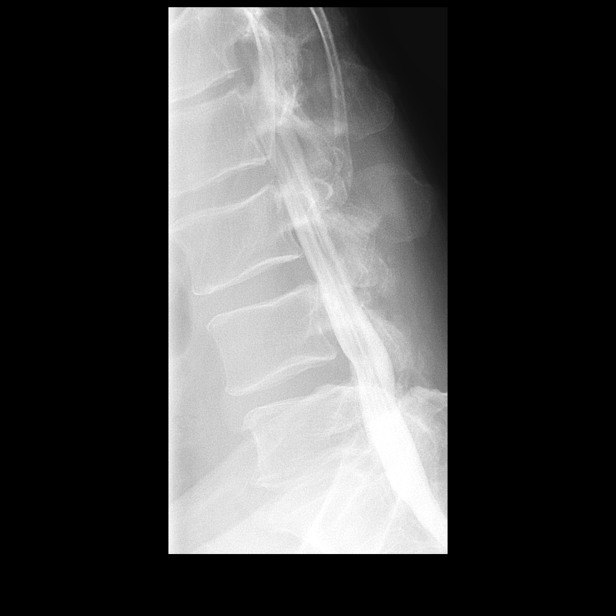

[Series 13: fluoro_myelogram_singleshot_bw · 0.17mm/px · 1 of 1 slices shown (10 of 10)]
[im 1/1]
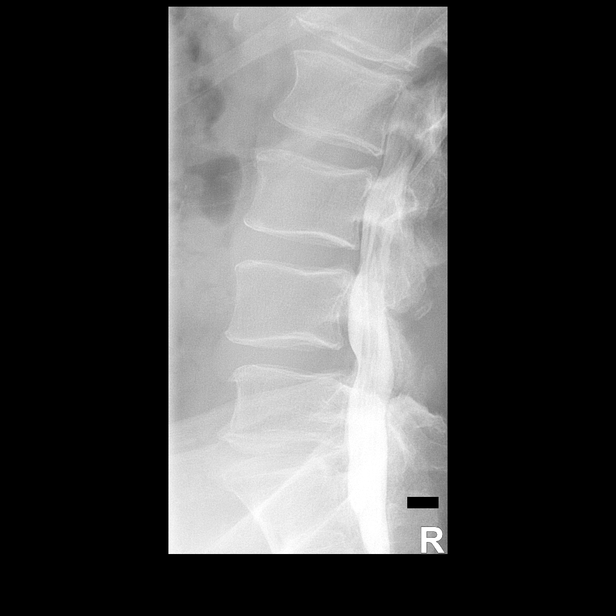

[12 of 12 positions shown; findings below may reference images not displayed]

EXAM:
LUMBAR MYELOGRAM

FLUOROSCOPY TIME:  Radiation Exposure Index (as provided by the
fluoroscopic device): 1231.71 uGy*m2

Fluoroscopy Time:  48 seconds

Number of Acquired Images:  10

PROCEDURE:
Lumbar puncture and intrathecal contrast administration were
performed by Dr. Onik who will separately report for the portion
of the procedure. I personally supervised acquisition of the
myelogram images.
FINDINGS: LUMBAR MYELOGRAM FINDINGS:

Two images of the needle are provided. Lumbar puncture is performed
on the left at L3-4.

Moderate left subarticular stenosis is present at L4-5. There is
medial deviation of the left L5 nerve root an early truncation of
the left L4 nerve root. There is mild subarticular narrowing on the
right at the same level. Nerve roots otherwise fill normally in the
lumbar spine.

Grade 1 anterolisthesis is evident at L4-5 upon standing. This is
slightly worse with flexion. The L4-5 disc protrusion is worse with
standing and flexion as well. AP alignment is otherwise anatomic.
The chronic loss of disc height at L5-S1.

CT LUMBAR MYELOGRAM FINDINGS:

The lumbar spine is imaged from the midbody of T12 through S2-3.

Vertebral body heights are maintained. AP alignment is anatomic.
Anterolisthesis at L4-5 is not evident. Degenerative changes are
noted at the SI joints bilaterally.

Atherosclerotic calcifications are present at aorta without aneurysm
or significant stenosis. Limited imaging of the abdomen is otherwise
unremarkable. There is no significant adenopathy.

T12-L1: Facet hypertrophy is worse on the right. There is
significant disc protrusion or stenosis.

L1-2: A mild broad-based disc bulge is present. Facet hypertrophy is
noted bilaterally. There is significant stenosis.

L2-3: A mild broad-based disc bulge is present. Facet hypertrophy
and short pedicles are present without focal stenosis.

L3-4: A broad-based disc protrusion is present. Moderate facet
hypertrophy and ligamentum flavum thickening is noted. Before
pedicles contribute to mild foraminal narrowing bilaterally.

L4-5: Laminectomy is present. Advanced facet hypertrophy and
ligamentum flavum thickening is noted. A broad-based disc protrusion
is evident. This leads to moderate left and mild right subarticular
narrowing. Moderate foraminal stenosis is worse on the right.

L5-S1: Moderate facet hypertrophy is present bilaterally the central
canal is patent. Mild foraminal narrowing is worse left than right.
IMPRESSION: 1. Laminectomy at L4 with residual or recurrent subarticular
stenosis, moderate on the left and mild on the right.
2. Moderate foraminal stenosis bilaterally at L4-5 is worse on the
right.
3. Mild foraminal narrowing bilaterally at L3-4 due to facet
hypertrophy and short pedicles.
4. Mild foraminal narrowing bilaterally at L5-S1 is worse on the
left. Moderate facet hypertrophy contributes.
5. Short pedicles throughout the lumbar spine.

## 2018-08-03 IMAGING — CT CT L SPINE W/ CM
3 of 9 series · 12 of 33 positions shown, 14 images · non-contrast
Comparison: MRI of the lumbar spine 05/06/2015

CLINICAL DATA: Low back pain extending along the posterior aspect
of both lower extremities to caps, left greater than right. Previous
lumbar spine surgery.
TECHNIQUE: Contiguous axial images were obtained through the Lumbar spine after
the intrathecal infusion of infusion. Coronal and sagittal
reconstructions were obtained of the axial image sets.

[Series 202: soft tissue, idose (2) · axial · 0.37mm/px · z∈[-328,-158]mm · 4 of 121 slices shown, 5 images]
[im 18/121  soft-tissue]
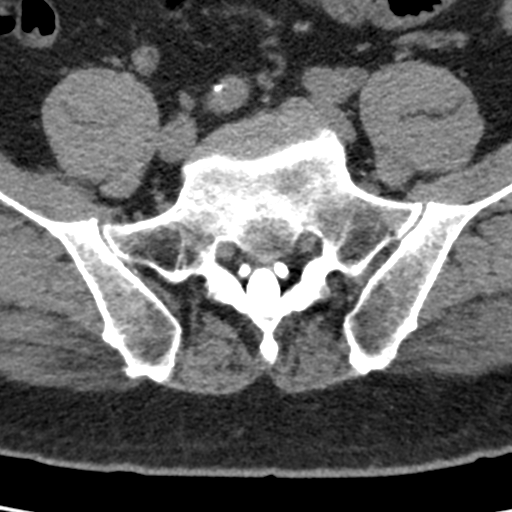
[im 18/121  bone]
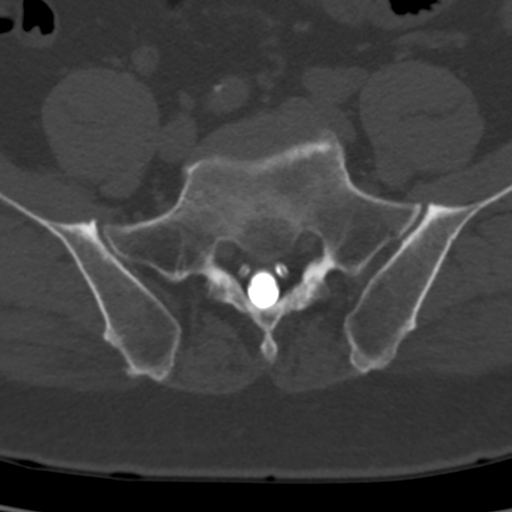
[im 52/121  bone]
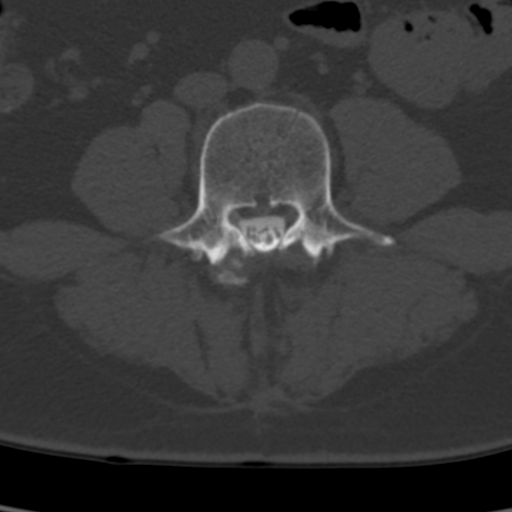
[im 69/121  bone]
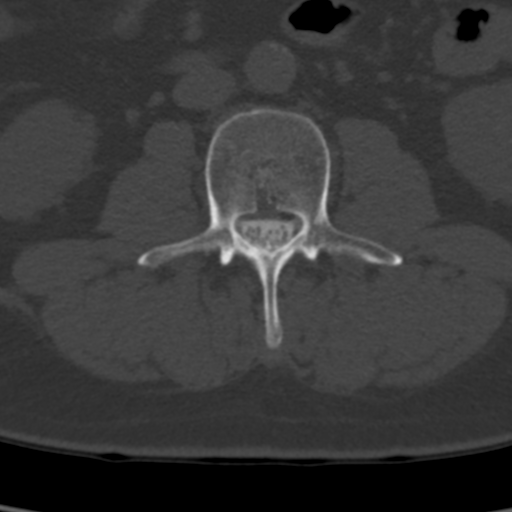
[im 103/121  bone]
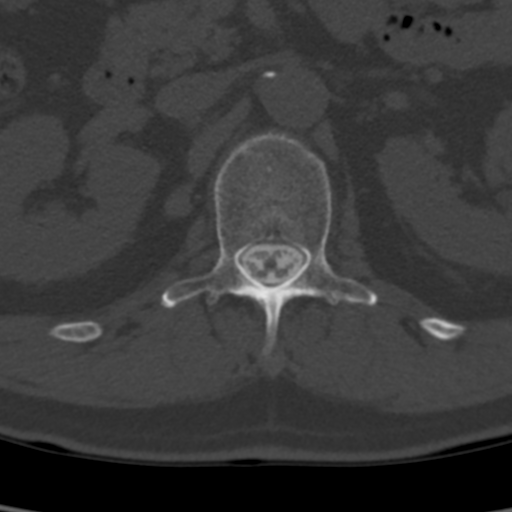

[Series 205: coronal st, idose (2) · coronal · 0.39mm/px · 3 of 74 slices shown]
[im 15/74  bone]
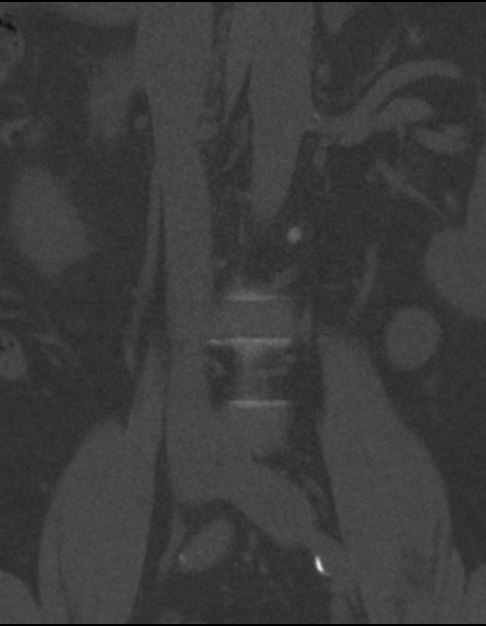
[im 30/74  bone]
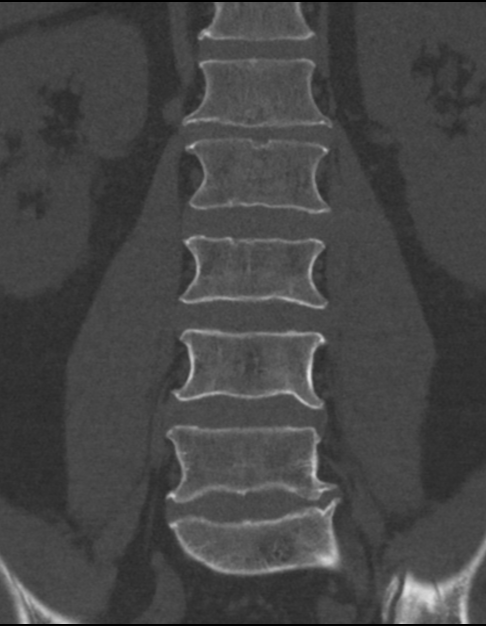
[im 44/74  bone]
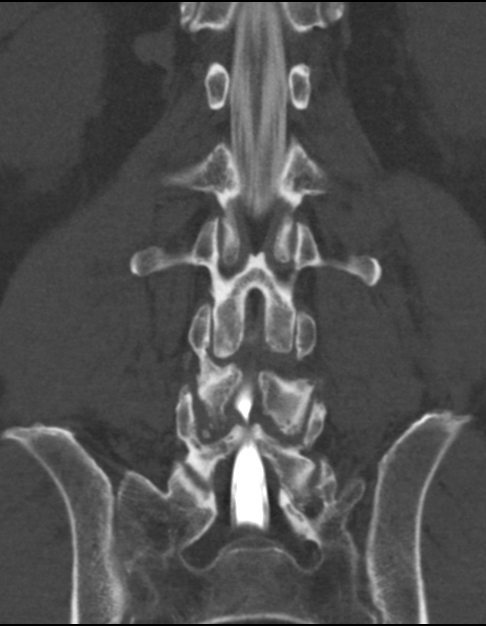

[Series 206: sagittal st, idose (2) · sagittal · 0.37mm/px · 5 of 77 slices shown, 6 images]
[im 26/77  bone]
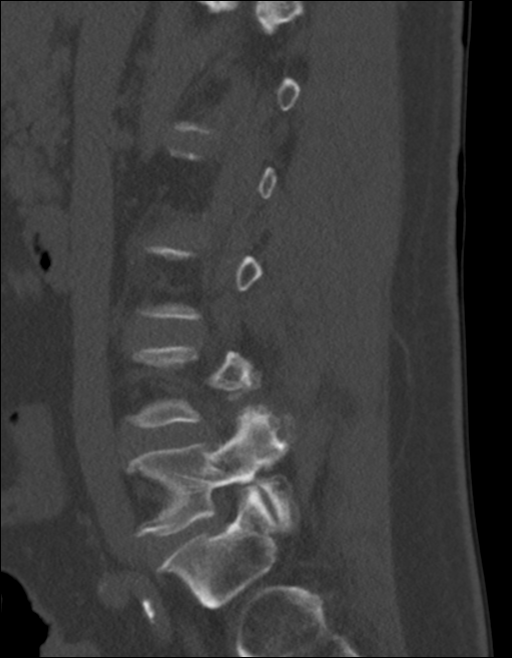
[im 32/77  bone]
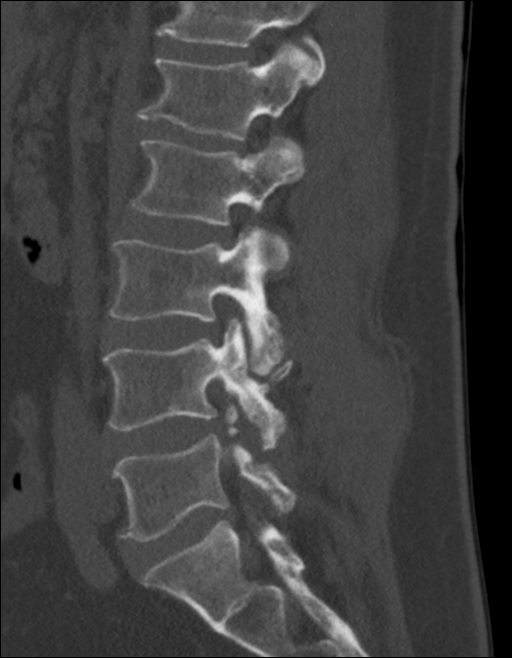
[im 39/77  soft-tissue]
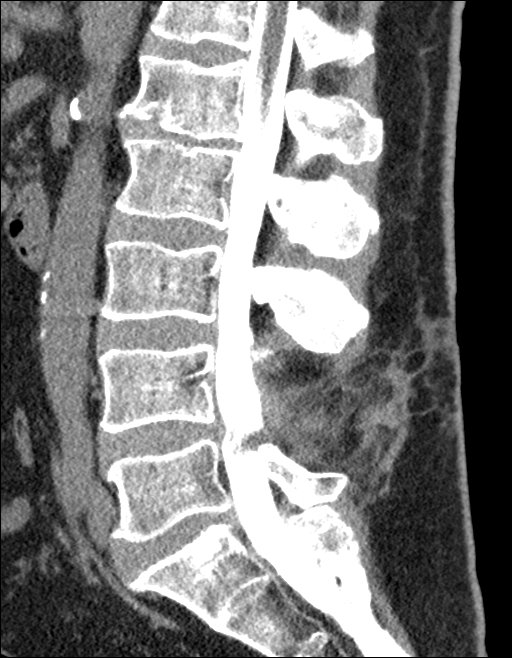
[im 39/77  bone]
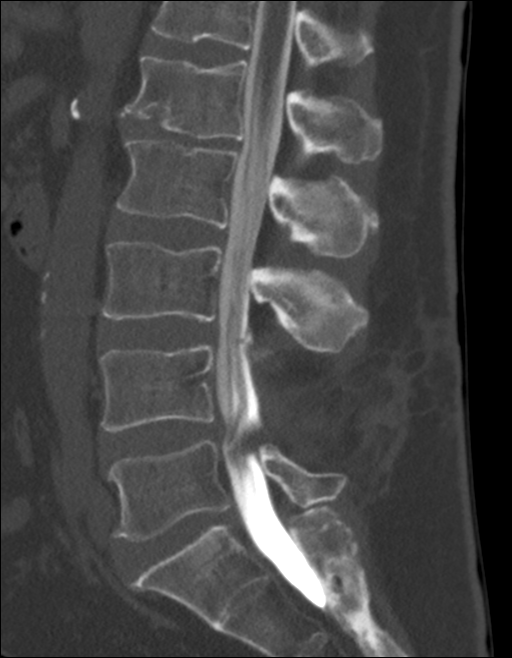
[im 45/77  bone]
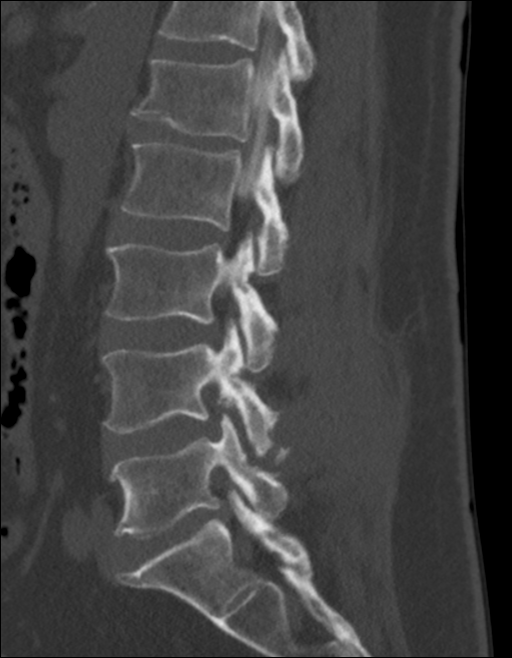
[im 51/77  bone]
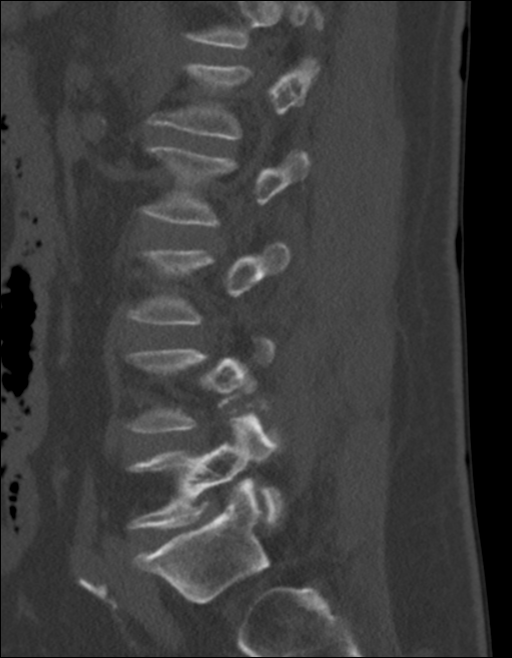

[12 of 33 positions shown; findings below may reference images not displayed]

EXAM:
LUMBAR MYELOGRAM

FLUOROSCOPY TIME:  Radiation Exposure Index (as provided by the
fluoroscopic device): 1231.71 uGy*m2

Fluoroscopy Time:  48 seconds

Number of Acquired Images:  10

PROCEDURE:
Lumbar puncture and intrathecal contrast administration were
performed by Dr. Onik who will separately report for the portion
of the procedure. I personally supervised acquisition of the
myelogram images.
FINDINGS: LUMBAR MYELOGRAM FINDINGS:

Two images of the needle are provided. Lumbar puncture is performed
on the left at L3-4.

Moderate left subarticular stenosis is present at L4-5. There is
medial deviation of the left L5 nerve root an early truncation of
the left L4 nerve root. There is mild subarticular narrowing on the
right at the same level. Nerve roots otherwise fill normally in the
lumbar spine.

Grade 1 anterolisthesis is evident at L4-5 upon standing. This is
slightly worse with flexion. The L4-5 disc protrusion is worse with
standing and flexion as well. AP alignment is otherwise anatomic.
The chronic loss of disc height at L5-S1.

CT LUMBAR MYELOGRAM FINDINGS:

The lumbar spine is imaged from the midbody of T12 through S2-3.

Vertebral body heights are maintained. AP alignment is anatomic.
Anterolisthesis at L4-5 is not evident. Degenerative changes are
noted at the SI joints bilaterally.

Atherosclerotic calcifications are present at aorta without aneurysm
or significant stenosis. Limited imaging of the abdomen is otherwise
unremarkable. There is no significant adenopathy.

T12-L1: Facet hypertrophy is worse on the right. There is
significant disc protrusion or stenosis.

L1-2: A mild broad-based disc bulge is present. Facet hypertrophy is
noted bilaterally. There is significant stenosis.

L2-3: A mild broad-based disc bulge is present. Facet hypertrophy
and short pedicles are present without focal stenosis.

L3-4: A broad-based disc protrusion is present. Moderate facet
hypertrophy and ligamentum flavum thickening is noted. Before
pedicles contribute to mild foraminal narrowing bilaterally.

L4-5: Laminectomy is present. Advanced facet hypertrophy and
ligamentum flavum thickening is noted. A broad-based disc protrusion
is evident. This leads to moderate left and mild right subarticular
narrowing. Moderate foraminal stenosis is worse on the right.

L5-S1: Moderate facet hypertrophy is present bilaterally the central
canal is patent. Mild foraminal narrowing is worse left than right.
IMPRESSION: 1. Laminectomy at L4 with residual or recurrent subarticular
stenosis, moderate on the left and mild on the right.
2. Moderate foraminal stenosis bilaterally at L4-5 is worse on the
right.
3. Mild foraminal narrowing bilaterally at L3-4 due to facet
hypertrophy and short pedicles.
4. Mild foraminal narrowing bilaterally at L5-S1 is worse on the
left. Moderate facet hypertrophy contributes.
5. Short pedicles throughout the lumbar spine.

## 2019-02-21 DIAGNOSIS — F411 Generalized anxiety disorder: Secondary | ICD-10-CM | POA: Diagnosis not present

## 2019-03-19 DIAGNOSIS — F411 Generalized anxiety disorder: Secondary | ICD-10-CM | POA: Diagnosis not present

## 2019-05-02 DIAGNOSIS — F411 Generalized anxiety disorder: Secondary | ICD-10-CM | POA: Diagnosis not present

## 2019-05-28 DIAGNOSIS — F411 Generalized anxiety disorder: Secondary | ICD-10-CM | POA: Diagnosis not present

## 2019-06-25 DIAGNOSIS — F411 Generalized anxiety disorder: Secondary | ICD-10-CM | POA: Diagnosis not present

## 2019-06-28 DIAGNOSIS — F411 Generalized anxiety disorder: Secondary | ICD-10-CM | POA: Diagnosis not present

## 2019-06-30 NOTE — Progress Notes (Signed)
Cardiology Office Note   Date:  07/01/2019   ID:  [name] [name], DOB [birth year], MRN [id number]  PCP:  Etta Quill, PA-C  Cardiologist:  Winfield Rast  CC: Follow Up   History of Present Illness: [name] [name] is a 60 y.o. male who presents for ongoing assessment and management of coronary artery disease, he has a history of CABG in 2004, fatty liver with mildly elevated LFTs, hypertension, hyperlipidemia, ISP, PUD status post pericardiotomy syndrome.  Most recent cardiac catheterization in 01/28/2008 revealed 50% left main lesion, 90% proximal LAD lesion followed by 100% occlusion of the after the septal perforator, 1% occluded RCA, patent LIMA to LAD, free radial graft to TS phimosis into the LIMA and T graft supplies the diagonal as well as posterior descending artery.  EF of 45% to 50%.  Medical therapy was recommended however if symptoms became more persistent consideration for PCI of the left main would be discussed.  Nuclear study November 2016 revealed EF of 47% prior infarct with mild peri-infarct ischemia.  He was last seen in the office by Azalee Course, PA, on 07/04/2017.  At that time he was complaining of having some pressure in the left side of his chest.  He denied any significant shortness of breath.  He had been seen in the ED around that time after having dizziness while working out at the fitness center.  A repeat stress test was ordered.  He was also to divide out his medications and take losartan HCTZ in the morning and metoprolol at night.  If blood pressure remained elevated the patient could increase his metoprolol to 100 mg at at bedtime.  He was to follow-up in 6 months.Nuclear medicine stress test dated 07/25/2017 revealed LV systolic function of 55% to 65%, found to be a low risk study, there was no evidence of ischemia or previous infarction.  Since being seen last, the patient has had femoral endarterectomy on the left due to occlusive plaque which occurred through [facility],  Dr. Florentina Addison, [facility].  He teaches cycling classes and was having numbness and tingling in his left leg, ABIs were completed left ABI 0.82.  Right ABI 1.13.  He had an arteriogram of the lower extremities, on 03/30/2018:  IMPRESSION:  1. Atherosclerotic plaque in the proximal right SFA causes mild to moderate narrowing. Dense plaque in the distal right SFA causes severe stenosis. The right anterior tibial artery and right posterior tibial artery are patent without evidence of   significant stenosis. The mid/distal right peroneal artery is small in size and not well evaluated.   2. Atherosclerotic plaque in the left common femoral artery causes moderate to severe narrowing in the mid to distal portions. This extends into the ostia of the profunda and SFA causing severe stenosis. The left posterior tibial artery is patent to the   foot. The left anterior tibial artery and peroneal artery appear patent but are small in size.    This was followed by a left femoral endarterectomy which was dated 06/06/2018 by Dr. Florentina Addison.  He has recovered well and has no further lower extremity edema.  He continues on high-dose atorvastatin 80 mg daily.  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   Past Medical History:  Diagnosis Date  . CAD (coronary artery disease)   . Fatty liver    with mildly elevated LFTs  . GERD (gastroesophageal reflux disease)   . HLD (hyperlipidemia)   .  HTN (hypertension)   . IBS (irritable bowel syndrome)   . Nephrolithiasis   . Post pericardiotomy syndrome   . PUD (peptic ulcer disease)     Past Surgical History:  Procedure Laterality Date  . CHOLECYSTECTOMY    . CORONARY ARTERY BYPASS GRAFT     lima as free graft to LAD, L radial artery as T graft off Lim to diagonal, OM1 and PDA     Current Outpatient Medications  Medication Sig Dispense Refill  . aspirin 81 MG EC tablet Take 1 tablet by mouth daily.    Marland Kitchen  atorvastatin (LIPITOR) 80 MG tablet Take 1 tablet (80 mg total) by mouth daily. 90 tablet 3  . dexlansoprazole (DEXILANT) 60 MG capsule Take 1 tablet by mouth every morning.    . ezetimibe (ZETIA) 10 MG tablet Take 10 mg by mouth at bedtime.     Marland Kitchen HYDROcodone-acetaminophen (NORCO/VICODIN) 5-325 MG tablet 1 tablet 3 (three) times daily as needed for pain. As needed    . losartan-hydrochlorothiazide (HYZAAR) 100-25 MG tablet Take 1 tablet by mouth every morning.  0  . metoprolol succinate (TOPROL-XL) 50 MG 24 hr tablet Take 50 mg by mouth daily at 12 noon. Take with or immediately following a meal.      No current facility-administered medications for this visit.    Allergies:   Hydrocodone-guaifenesin    Social History:  The patient  reports that he has quit smoking. His smoking use included cigarettes. He has never used smokeless tobacco. He reports current alcohol use.   Family History:  The patient's family history is not on file.    ROS: All other systems are reviewed and negative. Unless otherwise mentioned in H&P    PHYSICAL EXAM: VS:  BP (!) 144/90   Pulse (!) 49   Ht 6' (1.829 m)   Wt 234 lb 12.8 oz (106.5 kg)   BMI 31.84 kg/m  , BMI Body mass index is 31.84 kg/m. GEN: Well nourished, well developed, in no acute distress HEENT: normal Neck: no JVD, carotid bruits, or masses Cardiac: RRR, bradycardic no murmurs, rubs, or gallops,no edema  Respiratory:  Clear to auscultation bilaterally, normal work of breathing GI: soft, nontender, nondistended, + BS MS: no deformity or atrophy Skin: warm and dry, no rash Neuro:  Strength and sensation are intact Psych: euthymic mood, full affect   EKG: (Personally reviewed) sinus bradycardia heart rate of 49 bpm, compared to EKG on 12/2015 unchanged  Recent Labs: No results found for requested labs within last 8760 hours.    Lipid Panel    Component Value Date/Time   CHOL 125 01/04/2016 0937   TRIG 122 01/04/2016 0937   HDL  38 (L) 01/04/2016 0937   CHOLHDL 3.3 01/04/2016 0937   VLDL 24 01/04/2016 0937   LDLCALC 63 01/04/2016 0937      Wt Readings from Last 3 Encounters:  07/01/19 234 lb 12.8 oz (106.5 kg)  07/25/17 245 lb (111.1 kg)  07/04/17 245 lb (111.1 kg)      Other studies Reviewed: NM stress test  07/25/2017   EF: 58%. The left ventricular ejection fraction is normal (55-65%).  This is a low risk study. The study is normal. There is no evidence of ischemia or previous infarction.   ASSESSMENT AND PLAN:  1.  Coronary artery disease: Coronary artery bypass grafting in 2004, most recent cath in 2009 revealing patent LIMA to LAD, free radial graft to anastomosis into the LIMA and T graft, supplying the  diagonal as well as the posterior descending artery.  Most recent stress Myoview on 07/25/2017, revealed LVEF of 55 to 65% found to be a low risk study, the study was normal with no evidence of ischemia or previous infarction.  The patient will continue on secondary prevention with statin, he has metoprolol 50 mg p.o. daily at noon.  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  He denies any chest pain dizziness or dyspnea.  May need to consider decreasing metoprolol should he become symptomatic due to bradycardia.  2.  Hypertension: Currently not well controlled on this visit.  I did recheck his blood pressure in the exam room and found it to be 148/82.  He is currently on losartan HCTZ 100/25 mg daily along with metoprolol 50 mg at noon.  He states he has whitecoat syndrome.  He states at home and with other visits with his PCP blood pressure runs 120/60.  We will continue to follow this.  Medications are provided by his PCP.  We have given him paperwork concerning assistant with his medications as he has been out of work.  3.  PAD: Status post left femoral endarterectomy in April 2020.  Followed by [facility] and Dr. Carmine Savoy [facility].   4.  Hyperlipidemia: Continues on high-dose atorvastatin and Zetia 10 mg  daily.  Labs are drawn by PCP Dr. Twanna Hy.  He is due to see him next week for annual labs and physical exam.  Goal of LDL less than 70 with heart disease and peripheral arterial disease.  HDL goal greater than 40.  Current medicines are reviewed at length with the patient today.  I have spent 45 minutes dedicated to the care of this patient on the date of this encounter to include pre-visit review of records, assessment, management and diagnostic testing,with shared decision making.  Labs/ tests ordered today include: None (drawn by PCP next week) Phill Myron. [place] Pugh, ANP, AACC   07/01/2019 10:26 AM    [address] Office [phone] Fax [phone]  Notice: This dictation was prepared with Dragon dictation along with smaller phrase technology. Any transcriptional errors that result from this process are unintentional and may not be corrected upon review.

## 2019-07-01 ENCOUNTER — Encounter: Payer: Self-pay | Admitting: Adult Health

## 2019-07-01 ENCOUNTER — Other Ambulatory Visit: Payer: Self-pay

## 2019-07-01 ENCOUNTER — Ambulatory Visit (INDEPENDENT_AMBULATORY_CARE_PROVIDER_SITE_OTHER): Payer: BC Managed Care – PPO | Admitting: Adult Health

## 2019-07-01 VITALS — BP 144/90 | HR 49 | Ht 72.0 in | Wt 234.8 lb

## 2019-07-01 DIAGNOSIS — I739 Peripheral vascular disease, unspecified: Secondary | ICD-10-CM

## 2019-07-01 DIAGNOSIS — I10 Essential (primary) hypertension: Secondary | ICD-10-CM | POA: Diagnosis not present

## 2019-07-01 DIAGNOSIS — E785 Hyperlipidemia, unspecified: Secondary | ICD-10-CM

## 2019-07-01 DIAGNOSIS — I2510 Atherosclerotic heart disease of native coronary artery without angina pectoris: Secondary | ICD-10-CM | POA: Diagnosis not present

## 2019-07-01 NOTE — Patient Instructions (Signed)
Medication Instructions:  Continue current medications  *If you need a refill on your cardiac medications before your next appointment, please call your pharmacy*   Lab Work: None Ordered   Testing/Procedures: None Ordered   Follow-Up: At [facility], you and your health needs are our priority.  As part of our continuing mission to provide you with exceptional heart care, we have created designated Provider Care Teams.  These Care Teams include your primary Cardiologist (physician) and Advanced Practice Providers (APPs -  Physician Assistants and Nurse Practitioners) who all work together to provide you with the care you need, when you need it.  We recommend signing up for the patient portal called "MyChart".  Sign up information is provided on this After Visit Summary.  MyChart is used to connect with patients for Virtual Visits (Telemedicine).  Patients are able to view lab/test results, encounter notes, upcoming appointments, etc.  Non-urgent messages can be sent to your provider as well.   To learn more about what you can do with MyChart, go to [email or web address].    Your next appointment:   1 year(s)  The format for your next appointment:   In Person  Provider:   You may see Brian Crenshaw, MD or one of the following Advanced Practice Providers on your designated Care Team:    Luke Kilroy, PA-C  Callie Goodrich, PA-C  Jesse Cleaver, FNP      

## 2019-07-25 DIAGNOSIS — F411 Generalized anxiety disorder: Secondary | ICD-10-CM | POA: Diagnosis not present

## 2019-08-15 DIAGNOSIS — F411 Generalized anxiety disorder: Secondary | ICD-10-CM | POA: Diagnosis not present

## 2019-08-16 DIAGNOSIS — Z48812 Encounter for surgical aftercare following surgery on the circulatory system: Secondary | ICD-10-CM | POA: Diagnosis not present

## 2019-08-16 DIAGNOSIS — I739 Peripheral vascular disease, unspecified: Secondary | ICD-10-CM | POA: Diagnosis not present

## 2019-09-04 DIAGNOSIS — F411 Generalized anxiety disorder: Secondary | ICD-10-CM | POA: Diagnosis not present

## 2019-09-17 DIAGNOSIS — F411 Generalized anxiety disorder: Secondary | ICD-10-CM | POA: Diagnosis not present

## 2019-10-01 DIAGNOSIS — F411 Generalized anxiety disorder: Secondary | ICD-10-CM | POA: Diagnosis not present

## 2019-11-21 DIAGNOSIS — F411 Generalized anxiety disorder: Secondary | ICD-10-CM | POA: Diagnosis not present

## 2019-12-05 DIAGNOSIS — F411 Generalized anxiety disorder: Secondary | ICD-10-CM | POA: Diagnosis not present

## 2019-12-19 DIAGNOSIS — F411 Generalized anxiety disorder: Secondary | ICD-10-CM | POA: Diagnosis not present

## 2020-01-02 DIAGNOSIS — F411 Generalized anxiety disorder: Secondary | ICD-10-CM | POA: Diagnosis not present

## 2020-01-30 DIAGNOSIS — F411 Generalized anxiety disorder: Secondary | ICD-10-CM | POA: Diagnosis not present

## 2022-01-31 NOTE — Progress Notes (Unsigned)
HPI: FU coronary artery disease status post coronary bypass and graft in 2004. A cardiac catheterization was performed on January 28, 2008, by Dr. Clifton James. The left main had a 50% lesion. The LAD had a 90% lesion in the proximal portion followed by a 100% occlusion after a septal perforator. The circumflex artery had plaque disease in the proximal portion. The right coronary artery was 100% occluded. The free LIMA to the LAD was patent. There was also a free radial graft that had a T-anastomosis into the LIMA. The T-graft supplied the diagonal as well as the posterior descending artery. The entire graft was patent. Also note, it reportedly ties into a marginal. His ejection fraction was 45%-50%. It was felt that medical therapy was indicated. If symptoms persisted, then PCI of the left main could be considered. Nuclear study June 2019 showed ejection fraction 58% and no ischemia or infarction.  Patient has also had left femoral endarterectomy and is followed by vascular surgery at [facility]  Dopplers June 2023 showed no focal stenosis on the left.  ABIs July 2023 1.08 on the right and 1.14 on the left.  Since last seen  Current Outpatient Medications  Medication Sig Dispense Refill   aspirin 81 MG EC tablet Take 1 tablet by mouth daily.     atorvastatin (LIPITOR) 80 MG tablet Take 1 tablet (80 mg total) by mouth daily. 90 tablet 3   dexlansoprazole (DEXILANT) 60 MG capsule Take 1 tablet by mouth every morning.     ezetimibe (ZETIA) 10 MG tablet Take 10 mg by mouth at bedtime.      HYDROcodone-acetaminophen (NORCO/VICODIN) 5-325 MG tablet 1 tablet 3 (three) times daily as needed for pain. As needed     losartan-hydrochlorothiazide (HYZAAR) 100-25 MG tablet Take 1 tablet by mouth every morning.  0   metoprolol succinate (TOPROL-XL) 50 MG 24 hr tablet Take 50 mg by mouth daily at 12 noon. Take with or immediately following a meal.      No current facility-administered medications for this visit.      Past Medical History:  Diagnosis Date   CAD (coronary artery disease)    Fatty liver    with mildly elevated LFTs   GERD (gastroesophageal reflux disease)    HLD (hyperlipidemia)    HTN (hypertension)    IBS (irritable bowel syndrome)    Nephrolithiasis    Post pericardiotomy syndrome    PUD (peptic ulcer disease)     Past Surgical History:  Procedure Laterality Date   CHOLECYSTECTOMY     CORONARY ARTERY BYPASS GRAFT     lima as free graft to LAD, L radial artery as T graft off Lim to diagonal, OM1 and PDA    Social History   Socioeconomic History   Marital status: Married    Spouse name: Not on file   Number of children: Not on file   Years of education: Not on file   Highest education level: Not on file  Occupational History   Not on file  Tobacco Use   Smoking status: Former    Types: Cigarettes   Smokeless tobacco: Never   Tobacco comments:    quit 12 years ago  Substance and Sexual Activity   Alcohol use: Yes    Alcohol/week: 0.0 standard drinks of alcohol   Drug use: Not on file   Sexual activity: Not on file  Other Topics Concern   Not on file  Social History Narrative   Not on file  Social Determinants of Health   Financial Resource Strain: Not on file  Food Insecurity: Not on file  Transportation Needs: Not on file  Physical Activity: Not on file  Stress: Not on file  Social Connections: Not on file  Intimate Partner Violence: Not on file    No family history on file.  ROS: no fevers or chills, productive cough, hemoptysis, dysphasia, odynophagia, melena, hematochezia, dysuria, hematuria, rash, seizure activity, orthopnea, PND, pedal edema, claudication. Remaining systems are negative.  Physical Exam: Well-developed well-nourished in no acute distress.  Skin is warm and dry.  HEENT is normal.  Neck is supple.  Chest is clear to auscultation with normal expansion.  Cardiovascular exam is regular rate and rhythm.  Abdominal exam  nontender or distended. No masses palpated. Extremities show no edema. neuro grossly intact  ECG- personally reviewed  A/P  1 coronary artery disease status post coronary bypass graft-patient denies chest pain.  Previous nuclear study showed no ischemia.  Plan to continue medical therapy including aspirin and statin.  2 hypertension-patient's blood pressure is controlled.  Continue present medications and follow-up.  3 hyperlipidemia-continue Lipitor and Zetia.  4 peripheral vascular disease-patient denies claudication.  Continue medical therapy.  He is followed by vascular surgery at [facility].  Kirk Ruths, MD

## 2022-02-01 ENCOUNTER — Encounter: Payer: Self-pay | Admitting: Cardiology

## 2022-02-01 ENCOUNTER — Ambulatory Visit: Payer: Self-pay | Attending: Cardiology | Admitting: Cardiology

## 2022-02-01 VITALS — BP 110/64 | HR 66 | Ht 72.0 in | Wt 220.0 lb

## 2022-02-01 DIAGNOSIS — I2510 Atherosclerotic heart disease of native coronary artery without angina pectoris: Secondary | ICD-10-CM

## 2022-02-01 DIAGNOSIS — E7800 Pure hypercholesterolemia, unspecified: Secondary | ICD-10-CM

## 2022-02-01 DIAGNOSIS — R072 Precordial pain: Secondary | ICD-10-CM

## 2022-02-01 DIAGNOSIS — I10 Essential (primary) hypertension: Secondary | ICD-10-CM

## 2022-02-01 NOTE — Patient Instructions (Signed)
  Testing/Procedures: Your physician has requested that you have a lexiscan myoview. For further information please visit [email or web address] Please follow instruction sheet, as given. [address]   Follow-Up: At [facility], you and your health needs are our priority.  As part of our continuing mission to provide you with exceptional heart care, we have created designated Provider Care Teams.  These Care Teams include your primary Cardiologist (physician) and Advanced Practice Providers (APPs -  Physician Assistants and Nurse Practitioners) who all work together to provide you with the care you need, when you need it.  We recommend signing up for the patient portal called "MyChart".  Sign up information is provided on this After Visit Summary.  MyChart is used to connect with patients for Virtual Visits (Telemedicine).  Patients are able to view lab/test results, encounter notes, upcoming appointments, etc.  Non-urgent messages can be sent to your provider as well.   To learn more about what you can do with MyChart, go to [email or web address].    Your next appointment:   6 month(s)  The format for your next appointment:   In Person  Provider:   Olga Millers, MD [place]

## 2022-02-04 ENCOUNTER — Telehealth (HOSPITAL_COMMUNITY): Payer: Self-pay | Admitting: *Deleted

## 2022-02-04 NOTE — Telephone Encounter (Signed)
Left detailed voice message with instructions on 12/22 about STRESS TEST scheduled for 02/11/22 Patient was also asked to arrive 15 minutes prior to his scheduled visit.

## 2022-02-11 ENCOUNTER — Ambulatory Visit (HOSPITAL_COMMUNITY): Payer: 59 | Attending: Cardiology

## 2022-02-11 DIAGNOSIS — R072 Precordial pain: Secondary | ICD-10-CM | POA: Insufficient documentation

## 2022-02-11 LAB — MYOCARDIAL PERFUSION IMAGING
LV dias vol: 117 mL (ref 62–150)
LV sys vol: 47 mL
Nuc Stress EF: 60 %
Peak HR: 97 {beats}/min
Rest HR: 65 {beats}/min
Rest Nuclear Isotope Dose: 10.7 mCi
SDS: 0
SRS: 2
SSS: 2
ST Depression (mm): 0 mm
Stress Nuclear Isotope Dose: 32.4 mCi
TID: 0.98

## 2022-02-11 MED ORDER — TECHNETIUM TC 99M TETROFOSMIN IV KIT
10.7000 | PACK | Freq: Once | INTRAVENOUS | Status: AC | PRN
  Administered 2022-02-11: 10.7 via INTRAVENOUS

## 2022-02-11 MED ORDER — REGADENOSON 0.4 MG/5ML IV SOLN
0.4000 mg | Freq: Once | INTRAVENOUS | Status: AC
  Administered 2022-02-11: 0.4 mg via INTRAVENOUS

## 2022-02-11 MED ORDER — TECHNETIUM TC 99M TETROFOSMIN IV KIT
32.4000 | PACK | Freq: Once | INTRAVENOUS | Status: AC | PRN
  Administered 2022-02-11: 32.4 via INTRAVENOUS

## 2022-08-03 NOTE — Progress Notes (Signed)
HPI: FU coronary artery disease status post coronary bypass and graft in 2004. A cardiac catheterization was performed on January 28, 2008, by Dr. Clifton James. The left main had a 50% lesion. The LAD had a 90% lesion in the proximal portion followed by a 100% occlusion after a septal perforator. The circumflex artery had plaque disease in the proximal portion. The right coronary artery was 100% occluded. The free LIMA to the LAD was patent. There was also a free radial graft that had a T-anastomosis into the LIMA. The T-graft supplied the diagonal as well as the posterior descending artery. The entire graft was patent. Also note, it reportedly ties into a marginal. His ejection fraction was 45%-50%. It was felt that medical therapy was indicated. If symptoms persisted, then PCI of the left main could be considered. Patient has also had left femoral endarterectomy and is followed by vascular surgery at [facility].  Dopplers June 2023 showed no focal stenosis on the left.  ABIs July 2023 1.08 on the right and 1.14 on the left.  Nuclear study December 2023 showed diaphragmatic attenuation versus small infarct with minimal peri-infarct ischemia and normal LV function.  Since last seen the patient denies any dyspnea on exertion, orthopnea, PND, pedal edema, palpitations, syncope or chest pain.   Current Outpatient Medications  Medication Sig Dispense Refill   aspirin 81 MG EC tablet Take 1 tablet by mouth daily.     atorvastatin (LIPITOR) 80 MG tablet Take 1 tablet (80 mg total) by mouth daily. 90 tablet 3   dexlansoprazole (DEXILANT) 60 MG capsule Take 1 tablet by mouth every morning.     ezetimibe (ZETIA) 10 MG tablet Take 10 mg by mouth at bedtime.      HYDROcodone-acetaminophen (NORCO/VICODIN) 5-325 MG tablet 1 tablet 3 (three) times daily as needed for pain. As needed     losartan-hydrochlorothiazide (HYZAAR) 100-25 MG tablet Take 1 tablet by mouth every morning.  0   metoprolol succinate (TOPROL-XL) 50  MG 24 hr tablet Take 50 mg by mouth daily at 12 noon. Take with or immediately following a meal.      No current facility-administered medications for this visit.     Past Medical History:  Diagnosis Date   CAD (coronary artery disease)    Fatty liver    with mildly elevated LFTs   GERD (gastroesophageal reflux disease)    HLD (hyperlipidemia)    HTN (hypertension)    IBS (irritable bowel syndrome)    Nephrolithiasis    Post pericardiotomy syndrome    PUD (peptic ulcer disease)     Past Surgical History:  Procedure Laterality Date   CHOLECYSTECTOMY     CORONARY ARTERY BYPASS GRAFT     lima as free graft to LAD, L radial artery as T graft off Lim to diagonal, OM1 and PDA    Social History   Socioeconomic History   Marital status: Married    Spouse name: Not on file   Number of children: Not on file   Years of education: Not on file   Highest education level: Not on file  Occupational History   Not on file  Tobacco Use   Smoking status: Former    Types: Cigarettes   Smokeless tobacco: Never   Tobacco comments:    quit 12 years ago  Substance and Sexual Activity   Alcohol use: Yes    Alcohol/week: 0.0 standard drinks of alcohol   Drug use: Not on file   Sexual activity:  Not on file  Other Topics Concern   Not on file  Social History Narrative   Not on file   Social Determinants of Health   Financial Resource Strain: Not on file  Food Insecurity: Not on file  Transportation Needs: Not on file  Physical Activity: Not on file  Stress: Not on file  Social Connections: Not on file  Intimate Partner Violence: Not on file    History reviewed. No pertinent family history.  ROS: no fevers or chills, productive cough, hemoptysis, dysphasia, odynophagia, melena, hematochezia, dysuria, hematuria, rash, seizure activity, orthopnea, PND, pedal edema, claudication. Remaining systems are negative.  Physical Exam: Well-developed well-nourished in no acute distress.   Skin is warm and dry.  HEENT is normal.  Neck is supple.  Chest is clear to auscultation with normal expansion.  Cardiovascular exam is regular rate and rhythm.  Abdominal exam nontender or distended. No masses palpated. Extremities show no edema. neuro grossly intact  ECG-sinus bradycardia at a rate of 57, inferior infarct.  Personally reviewed  A/P  1 coronary artery disease status post coronary artery bypass and graft-patient denies recurrent chest pain.  Recent nuclear study low risk.  Plan to continue aspirin and statin.  2 hypertension-blood pressure elevated.  Add amlodipine to 2.5 mg daily and follow.  3 hyperlipidemia-continue Lipitor and Zetia.  4 peripheral vascular disease-continue present medications and follow-up with vascular surgery at [facility].  Olga Millers, MD

## 2022-08-10 ENCOUNTER — Ambulatory Visit (INDEPENDENT_AMBULATORY_CARE_PROVIDER_SITE_OTHER): Payer: 59 | Admitting: Cardiology

## 2022-08-10 ENCOUNTER — Encounter: Payer: Self-pay | Admitting: Cardiology

## 2022-08-10 VITALS — BP 142/62 | HR 60 | Ht 72.0 in | Wt 228.8 lb

## 2022-08-10 DIAGNOSIS — I2510 Atherosclerotic heart disease of native coronary artery without angina pectoris: Secondary | ICD-10-CM | POA: Diagnosis not present

## 2022-08-10 DIAGNOSIS — I10 Essential (primary) hypertension: Secondary | ICD-10-CM | POA: Diagnosis not present

## 2022-08-10 DIAGNOSIS — E7800 Pure hypercholesterolemia, unspecified: Secondary | ICD-10-CM | POA: Diagnosis not present

## 2022-08-10 MED ORDER — AMLODIPINE BESYLATE 2.5 MG PO TABS: 2.5000 mg | ORAL_TABLET | Freq: Every day | ORAL | 3 refills | Status: DC

## 2022-08-10 NOTE — Patient Instructions (Signed)
Medication Instructions:   START AMLODIPINE 2.5 MG ONCE DAILY  *If you need a refill on your cardiac medications before your next appointment, please call your pharmacy*   Follow-Up: At [facility], you and your health needs are our priority.  As part of our continuing mission to provide you with exceptional heart care, we have created designated Provider Care Teams.  These Care Teams include your primary Cardiologist (physician) and Advanced Practice Providers (APPs -  Physician Assistants and Nurse Practitioners) who all work together to provide you with the care you need, when you need it.  We recommend signing up for the patient portal called "MyChart".  Sign up information is provided on this After Visit Summary.  MyChart is used to connect with patients for Virtual Visits (Telemedicine).  Patients are able to view lab/test results, encounter notes, upcoming appointments, etc.  Non-urgent messages can be sent to your provider as well.   To learn more about what you can do with MyChart, go to [email or web address].    Your next appointment:   12 month(s)  Provider:   Olga Millers MD

## 2023-01-20 ENCOUNTER — Telehealth: Payer: Self-pay | Admitting: Cardiology

## 2023-01-20 NOTE — Telephone Encounter (Signed)
Spoke with pt, he is needing surgery in February. Ask patient to have the surgeon to fax clearance to [phone] for the clearance process to get started.

## 2023-01-20 NOTE — Telephone Encounter (Signed)
Pt called asking to speak with Stanton Kidney, RN.

## 2023-01-23 ENCOUNTER — Telehealth: Payer: Self-pay | Admitting: *Deleted

## 2023-01-23 NOTE — Telephone Encounter (Signed)
 Patient scheduled for 02/17/23

## 2023-01-23 NOTE — Telephone Encounter (Signed)
   Name: [name] [name]  DOB: 30-Aug-1960  MRN: [id number]  Primary Cardiologist: Dr. Jens Som  Preoperative team, please contact this patient and set up a phone call appointment for further preoperative risk assessment. Please obtain consent and complete medication review. Thank you for your help.  I confirm that guidance regarding antiplatelet and oral anticoagulation therapy has been completed and, if necessary, noted below.  Per office protocol patient may hold aspirin 81 mg daily for 5-7 days prior to procedure, please resume when safe to do so from a bleeding standpoint.   I also confirmed the patient resides in the state of [name]. As per [facility] Medical Board telemedicine laws, the patient must reside in the state in which the provider is licensed.  Rip Harbour, NP 01/23/2023, 3:43 PM [facility]

## 2023-01-23 NOTE — Telephone Encounter (Signed)
   Pre-operative Risk Assessment    Patient Name: [name] [name]  DOB: Jul 05, 1960 MRN: [id number]   Last OV: Dr. Jens Som 08/10/2022 Upcoming OV: None  Request for Surgical Clearance    Procedure:   Lumbar Spinal Stenosis and radiculopathy. L3-4 right,L4-5 and L5-S1 left revision decompression with posterior spinal fusion L3-S1.   Date of Surgery:  Clearance 03/20/23                                 Surgeon:  Dr. Grandville Silos Surgeon's Group or Practice Name:  [facility] Phone number:  [phone] Fax number:  [phone]   Type of Clearance Requested:   - Medical  - Pharmacy:  Hold Aspirin Not Indicated.   Type of Anesthesia:  Not Indicated   Additional requests/questions:    Signed, Emmit Pomfret   01/23/2023, 1:19 PM

## 2023-01-23 NOTE — Telephone Encounter (Signed)
  Patient Consent for Virtual Visit    [name] [name] has provided verbal consent on 01/23/2023 for a virtual visit (video or telephone).   CONSENT FOR VIRTUAL VISIT FOR:  [name] [name]  By participating in this virtual visit I agree to the following:  I hereby voluntarily request, consent and authorize [facility] and its employed or contracted physicians, physician assistants, nurse practitioners or other licensed health care professionals (the Practitioner), to provide me with telemedicine health care services (the "Services") as deemed necessary by the treating Practitioner. I acknowledge and consent to receive the Services by the Practitioner via telemedicine. I understand that the telemedicine visit will involve communicating with the Practitioner through live audiovisual communication technology and the disclosure of certain medical information by electronic transmission. I acknowledge that I have been given the opportunity to request an in-person assessment or other available alternative prior to the telemedicine visit and am voluntarily participating in the telemedicine visit.  I understand that I have the right to withhold or withdraw my consent to the use of telemedicine in the course of my care at any time, without affecting my right to future care or treatment, and that the Practitioner or I may terminate the telemedicine visit at any time. I understand that I have the right to inspect all information obtained and/or recorded in the course of the telemedicine visit and may receive copies of available information for a reasonable fee.  I understand that some of the potential risks of receiving the Services via telemedicine include:  Delay or interruption in medical evaluation due to technological equipment failure or disruption; Information transmitted may not be sufficient (e.g. poor resolution of images) to allow for appropriate medical decision making by the Practitioner;  and/or  In rare instances, security protocols could fail, causing a breach of personal health information.  Furthermore, I acknowledge that it is my responsibility to provide information about my medical history, conditions and care that is complete and accurate to the best of my ability. I acknowledge that Practitioner's advice, recommendations, and/or decision may be based on factors not within their control, such as incomplete or inaccurate data provided by me or distortions of diagnostic images or specimens that may result from electronic transmissions. I understand that the practice of medicine is not an exact science and that Practitioner makes no warranties or guarantees regarding treatment outcomes. I acknowledge that a copy of this consent can be made available to me via my patient portal [facility] MyChart), or I can request a printed copy by calling the office of [facility].    I understand that my insurance will be billed for this visit.   I have read or had this consent read to me. I understand the contents of this consent, which adequately explains the benefits and risks of the Services being provided via telemedicine.  I have been provided ample opportunity to ask questions regarding this consent and the Services and have had my questions answered to my satisfaction. I give my informed consent for the services to be provided through the use of telemedicine in my medical care

## 2023-02-16 NOTE — Progress Notes (Signed)
 Virtual Visit via Telephone Note   Because of [name] [name]'s co-morbid illnesses, he is at least at moderate risk for complications without adequate follow up.  This format is felt to be most appropriate for this patient at this time.  The patient did not have access to video technology/had technical difficulties with video requiring transitioning to audio format only (telephone).  All issues noted in this document were discussed and addressed.  No physical exam could be performed with this format.  Please refer to the patient's chart for his consent to telehealth for [name] [name].  Evaluation Performed:  Preoperative cardiovascular risk assessment _____________   Date:  02/17/2023   Patient ID:  [name] [name], DOB 1960-04-18, MRN [id number] Patient Location:  Home Provider location:   Office  Primary Care Provider:  Dorise Toribio PARAS, PA-C Primary Cardiologist:  Redell Shallow, MD  Chief Complaint / Patient Profile   63 y.o. y/o male with a h/o CAD s/p CABG in 2004, repeat catheterization in 2009 with 90% lesion in proximal portion of LAD followed by 100% occlusion after septal perforator, RCA 100% occluded, free LIMA to LAD was patent recommendation for medical therapy, nuclear stress test 02/11/2022 low risk, hypertension, hyperlipidemia, PVD who is pending L3-4 right, L4-5 and L5-S1 left revision decompression with posterior spinal and fusion L3-S1 with Dr. Wallene on 03/20/2023 and presents today for telephonic preoperative cardiovascular risk assessment.  History of Present Illness    [name] [name] is a 63 y.o. male who presents via audio/video conferencing for a telehealth visit today.  Pt was last seen in cardiology clinic on 08/10/22 by Dr. Shallow.  At that time [name] [name] was doing well.  The patient is now pending procedure as outlined above. Since his last visit, he denies chest pain, shortness of breath, lower extremity edema, fatigue, palpitations, melena,  hematuria, hemoptysis, diaphoresis, weakness, presyncope, syncope, orthopnea, and PND. He has been physically limited by back pain for the past 6 months. He is a counselling psychologist and has tried to remain as active as possible with walking, regular house work. He is able to achieve > 4 METS Activity without concerning cardiac symptoms.   Past Medical History    Past Medical History:  Diagnosis Date   CAD (coronary artery disease)    Fatty liver    with mildly elevated LFTs   GERD (gastroesophageal reflux disease)    HLD (hyperlipidemia)    HTN (hypertension)    IBS (irritable bowel syndrome)    Nephrolithiasis    Post pericardiotomy syndrome    PUD (peptic ulcer disease)    Past Surgical History:  Procedure Laterality Date   CHOLECYSTECTOMY     CORONARY ARTERY BYPASS GRAFT     lima as free graft to LAD, L radial artery as T graft off Lim to diagonal, OM1 and PDA    Allergies  Allergies  Allergen Reactions   Hydrocodone-Guaifenesin Nausea And Vomiting and Nausea Only    Hycodan Cough Med     Home Medications    Prior to Admission medications   Medication Sig Start Date End Date Taking? Authorizing Provider  amLODipine  (NORVASC ) 2.5 MG tablet Take 1 tablet (2.5 mg total) by mouth daily. 08/10/22 11/08/22  Shallow Redell RAMAN, MD  aspirin 81 MG EC tablet Take 1 tablet by mouth daily.    [provider]  atorvastatin  (LIPITOR) 80 MG tablet Take 1 tablet (80 mg total) by mouth daily. 12/15/14   Shallow Redell RAMAN, MD  dexlansoprazole (DEXILANT) 60 MG capsule Take 1 tablet by mouth every morning. 07/03/17   [provider]  ezetimibe (ZETIA) 10 MG tablet Take 10 mg by mouth at bedtime.     [provider]  HYDROcodone-acetaminophen  (NORCO/VICODIN) 5-325 MG tablet 1 tablet 3 (three) times daily as needed for pain. As needed 02/09/11   [provider]  losartan-hydrochlorothiazide (HYZAAR) 100-25 MG tablet Take 1 tablet by mouth every morning. 06/05/17    [provider]  metoprolol succinate (TOPROL-XL) 50 MG 24 hr tablet Take 50 mg by mouth daily at 12 noon. Take with or immediately following a meal.     [provider]    Physical Exam    Vital Signs:  [name] [name] does not have vital signs available for review today.  Given telephonic nature of communication, physical exam is limited. AAOx3. NAD. Normal affect.  Speech and respirations are unlabored.  Accessory Clinical Findings    None  Assessment & Plan    1.  Preoperative Cardiovascular Risk Assessment: According to the Revised Cardiac Risk Index (RCRI), his Perioperative Risk of Major Cardiac Event is (%): 6.6. His Functional Capacity in METs is: 9.34 according to the Duke Activity Status Index (DASI). The patient is doing well from a cardiac perspective. Therefore, based on ACC/AHA guidelines, the patient would be at acceptable risk for the planned procedure without further cardiovascular testing.   The patient was advised that if he develops new symptoms prior to surgery to contact our office to arrange for a follow-up visit, and he verbalized understanding.  Per office protocol patient may hold aspirin 81 mg daily for 5-7 days prior to procedure, please resume when safe to do so from a bleeding standpoint.   A copy of this note will be routed to requesting surgeon.  Time:   Today, I have spent 10 minutes with the patient with telehealth technology discussing medical history, symptoms, and management plan.    [name] EMERSON Bane, NP-C  02/17/2023, 9:44 AM [address] Office [phone] Fax [phone]

## 2023-02-17 ENCOUNTER — Ambulatory Visit: Payer: BC Managed Care – PPO | Attending: Internal Medicine | Admitting: Nurse Practitioner

## 2023-02-17 DIAGNOSIS — Z01810 Encounter for preprocedural cardiovascular examination: Secondary | ICD-10-CM

## 2023-08-25 ENCOUNTER — Other Ambulatory Visit: Payer: Self-pay | Admitting: Cardiology
